# Patient Record
Sex: Female | Born: 1990
Health system: Southern US, Community
[De-identification: ages and names within clinical notes are randomized; demographics above are authoritative.]

## PROBLEM LIST (undated history)

## (undated) ENCOUNTER — Inpatient Hospital Stay (HOSPITAL_COMMUNITY): Payer: Self-pay

## (undated) DIAGNOSIS — I10 Essential (primary) hypertension: Secondary | ICD-10-CM

## (undated) DIAGNOSIS — B009 Herpesviral infection, unspecified: Secondary | ICD-10-CM

## (undated) DIAGNOSIS — J302 Other seasonal allergic rhinitis: Secondary | ICD-10-CM

## (undated) DIAGNOSIS — A599 Trichomoniasis, unspecified: Secondary | ICD-10-CM

## (undated) DIAGNOSIS — O039 Complete or unspecified spontaneous abortion without complication: Secondary | ICD-10-CM

## (undated) DIAGNOSIS — L309 Dermatitis, unspecified: Secondary | ICD-10-CM

## (undated) HISTORY — PX: NO PAST SURGERIES: SHX2092

---
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
## 2012-01-25 ENCOUNTER — Emergency Department (HOSPITAL_BASED_OUTPATIENT_CLINIC_OR_DEPARTMENT_OTHER)
Admission: EM | Admit: 2012-01-25 | Discharge: 2012-01-25 | Disposition: A | Discharge status: Home / Self Care | Payer: Self-pay | Attending: Emergency Medicine | Admitting: Emergency Medicine

## 2012-01-25 ENCOUNTER — Encounter (HOSPITAL_BASED_OUTPATIENT_CLINIC_OR_DEPARTMENT_OTHER): Payer: Self-pay | Admitting: *Deleted

## 2012-01-25 DIAGNOSIS — B86 Scabies: Secondary | ICD-10-CM | POA: Insufficient documentation

## 2012-01-25 MED ORDER — PERMETHRIN 5 % EX CREA: TOPICAL_CREAM | CUTANEOUS | Status: AC

## 2012-01-25 NOTE — ED Notes (Signed)
EDPA Sofia at bedside. 

## 2012-01-25 NOTE — ED Notes (Signed)
Pt reports she has rash to face and right foot x 1 day- reports her brother was treated to scabies 1 month ago

## 2012-01-25 NOTE — ED Notes (Signed)
rx x 1 given- d/c with mother

## 2012-01-26 NOTE — ED Provider Notes (Signed)
History     CSN: [id number]  Arrival date & time 01/25/12  2054   First MD Initiated Contact with Patient 01/25/12 2137      Chief Complaint  Patient presents with  . Rash    (Consider location/radiation/quality/duration/timing/severity/associated sxs/prior treatment) Patient is a 21 y.o. female presenting with rash. The history is provided by the patient. No language interpreter was used.  Rash  This is a new problem. The current episode started more than 1 week ago. The problem has been gradually worsening. Associated with: pt reports sibling recently treated for scabies. The rash is present on the right hand, left hand and face. The pain is moderate. Associated symptoms include itching.    History reviewed. No pertinent past medical history.  History reviewed. No pertinent past surgical history.  No family history on file.  History  Substance Use Topics  . Smoking status: Never Smoker   . Smokeless tobacco: Never Used  . Alcohol Use: No    OB History    Grav Para Term Preterm Abortions TAB SAB Ect Mult Living                  Review of Systems  Skin: Positive for itching and rash.  All other systems reviewed and are negative.    Allergies  Review of patient's allergies indicates no known allergies.  Home Medications   Current Outpatient Rx  Name Route Sig Dispense Refill  . PERMETHRIN 5 % EX CREA  Apply to affected area once 60 g 1    BP 147/85  Pulse 82  Temp 99.4 F (37.4 C) (Oral)  Resp 18  Ht 5\' 9"  (1.753 m)  Wt 230 lb (104.327 kg)  BMI 33.96 kg/m2  SpO2 100%  LMP 01/17/2012  Physical Exam  Nursing note and vitals reviewed. Constitutional: She is oriented to person, place, and time. She appears well-developed and well-nourished.  HENT:  Head: Normocephalic.  Eyes: Pupils are equal, round, and reactive to light.  Cardiovascular: Normal rate.   Pulmonary/Chest: Effort normal.  Musculoskeletal: Normal range of motion.  Neurological: She  is alert and oriented to person, place, and time. She has normal reflexes.  Skin: Rash noted.  Psychiatric: She has a normal mood and affect.    ED Course  Procedures (including critical care time)  Labs Reviewed - No data to display No results found.   1. Scabies       MDM  elemite       Lonia Skinner [place], [place] 01/26/12 1539

## 2012-01-27 NOTE — ED Provider Notes (Signed)
Medical screening examination/treatment/procedure(s) were performed by non-physician practitioner and as supervising physician I was immediately available for consultation/collaboration.   Charles B. Bernette Mayers, MD 01/27/12 [phone]

## 2012-03-08 ENCOUNTER — Encounter (HOSPITAL_BASED_OUTPATIENT_CLINIC_OR_DEPARTMENT_OTHER): Payer: Self-pay | Admitting: *Deleted

## 2012-03-08 ENCOUNTER — Emergency Department (HOSPITAL_BASED_OUTPATIENT_CLINIC_OR_DEPARTMENT_OTHER)
Admission: EM | Admit: 2012-03-08 | Discharge: 2012-03-08 | Disposition: A | Discharge status: Home / Self Care | Payer: Self-pay | Attending: Emergency Medicine | Admitting: Emergency Medicine

## 2012-03-08 DIAGNOSIS — A499 Bacterial infection, unspecified: Secondary | ICD-10-CM | POA: Insufficient documentation

## 2012-03-08 DIAGNOSIS — B3731 Acute candidiasis of vulva and vagina: Secondary | ICD-10-CM | POA: Insufficient documentation

## 2012-03-08 DIAGNOSIS — B373 Candidiasis of vulva and vagina: Secondary | ICD-10-CM

## 2012-03-08 DIAGNOSIS — Z202 Contact with and (suspected) exposure to infections with a predominantly sexual mode of transmission: Secondary | ICD-10-CM | POA: Insufficient documentation

## 2012-03-08 DIAGNOSIS — B9689 Other specified bacterial agents as the cause of diseases classified elsewhere: Secondary | ICD-10-CM | POA: Insufficient documentation

## 2012-03-08 DIAGNOSIS — N760 Acute vaginitis: Secondary | ICD-10-CM | POA: Insufficient documentation

## 2012-03-08 LAB — URINALYSIS, ROUTINE W REFLEX MICROSCOPIC
Glucose, UA: NEGATIVE mg/dL
Ketones, ur: NEGATIVE mg/dL
Nitrite: NEGATIVE
Protein, ur: NEGATIVE mg/dL
pH: 7 (ref 5.0–8.0)

## 2012-03-08 LAB — PREGNANCY, URINE: Preg Test, Ur: NEGATIVE

## 2012-03-08 LAB — URINE MICROSCOPIC-ADD ON

## 2012-03-08 LAB — WET PREP, GENITAL

## 2012-03-08 MED ORDER — AZITHROMYCIN 250 MG PO TABS
1000.0000 mg | ORAL_TABLET | Freq: Once | ORAL | Status: AC
  Administered 2012-03-08: 1000 mg via ORAL
  Filled 2012-03-08: qty 4

## 2012-03-08 MED ORDER — METRONIDAZOLE 500 MG PO TABS: 500.0000 mg | ORAL_TABLET | Freq: Two times a day (BID) | ORAL | Status: DC

## 2012-03-08 MED ORDER — CEFTRIAXONE SODIUM 1 G IJ SOLR
1.0000 g | Freq: Once | INTRAMUSCULAR | Status: AC
  Administered 2012-03-08: 1 g via INTRAMUSCULAR
  Filled 2012-03-08: qty 10

## 2012-03-08 MED ORDER — FLUCONAZOLE 150 MG PO TABS: 150.0000 mg | ORAL_TABLET | Freq: Once | ORAL | Status: DC

## 2012-03-08 NOTE — ED Provider Notes (Signed)
History     CSN: [id number]  Arrival date & time 03/08/12  4098   First MD Initiated Contact with Patient 03/08/12 1953      Chief Complaint  Patient presents with  . Vaginal Discharge    (Consider location/radiation/quality/duration/timing/severity/associated sxs/prior treatment) HPI Comments: 21 y/o female presents to the ED complaining of "creamy" white vaginal discharge for the past 2 days. Admits to having unprotected intercourse with a new partner 1 week ago. Prior to that she had no discharge. She is unsure if he has any STDs, but she is concerned. Admits to associated dysuria, vaginal itching and odor. Denies abdominal or pelvic pain, fever or chills, rashes, vaginal bleeding, hematuria, nausea or vomiting.  Patient is a 21 y.o. female presenting with vaginal discharge. The history is provided by the patient.  Vaginal Discharge Pertinent negatives include no abdominal pain, arthralgias, chest pain, chills, fever, nausea, neck pain, rash or vomiting.    History reviewed. No pertinent past medical history.  History reviewed. No pertinent past surgical history.  No family history on file.  History  Substance Use Topics  . Smoking status: Never Smoker   . Smokeless tobacco: Never Used  . Alcohol Use: No    OB History    Grav Para Term Preterm Abortions TAB SAB Ect Mult Living                  Review of Systems  Constitutional: Negative for fever and chills.  HENT: Negative for neck pain.   Respiratory: Negative for shortness of breath.   Cardiovascular: Negative for chest pain.  Gastrointestinal: Negative for nausea, vomiting and abdominal pain.  Genitourinary: Positive for dysuria and vaginal discharge. Negative for hematuria, vaginal bleeding, genital sores, vaginal pain, pelvic pain and dyspareunia.  Musculoskeletal: Negative for back pain and arthralgias.  Skin: Negative for rash.  Neurological: Negative for dizziness and light-headedness.    Psychiatric/Behavioral: The patient is not nervous/anxious.     Allergies  Review of patient's allergies indicates no known allergies.  Home Medications  No current outpatient prescriptions on file.  BP 143/71  Pulse 90  Temp 98.3 F (36.8 C) (Oral)  Resp 16  SpO2 100%  LMP 02/11/2012  Physical Exam  Constitutional: She is oriented to person, place, and time. She appears well-developed and well-nourished. No distress.  HENT:  Head: Normocephalic and atraumatic.  Mouth/Throat: Oropharynx is clear and moist.  Eyes: Conjunctivae normal are normal.  Neck: Normal range of motion.  Cardiovascular: Normal rate, regular rhythm and normal heart sounds.   Pulmonary/Chest: Effort normal and breath sounds normal.  Abdominal: Soft. Bowel sounds are normal. There is no tenderness.  Genitourinary: Uterus normal. There is no rash or tenderness on the right labia. There is no rash or tenderness on the left labia. Cervix exhibits discharge (thick, white) and friability. Cervix exhibits no motion tenderness. Right adnexum displays no tenderness. Left adnexum displays no tenderness. There is erythema around the vagina. No bleeding around the vagina. Vaginal discharge (thick, white) found.  Musculoskeletal: Normal range of motion.  Neurological: She is alert and oriented to person, place, and time.  Skin: Skin is warm and dry. No rash noted. She is not diaphoretic. No erythema.  Psychiatric: She has a normal mood and affect. Her behavior is normal.    ED Course  Procedures (including critical care time)   Labs Reviewed  WET PREP, GENITAL  GC/CHLAMYDIA PROBE AMP, GENITAL  URINALYSIS, ROUTINE W REFLEX MICROSCOPIC  PREGNANCY, URINE   Results for  orders placed during the hospital encounter of 03/08/12  WET PREP, GENITAL      Component Value Range   Yeast Wet Prep HPF POC FEW (*) NONE SEEN   Trich, Wet Prep NONE SEEN  NONE SEEN   Clue Cells Wet Prep HPF POC MODERATE (*) NONE SEEN   WBC, Wet  Prep HPF POC MANY (*) NONE SEEN  URINALYSIS, ROUTINE W REFLEX MICROSCOPIC      Component Value Range   Color, Urine YELLOW  YELLOW   APPearance CLOUDY (*) CLEAR   Specific Gravity, Urine 1.025  1.005 - 1.030   pH 7.0  5.0 - 8.0   Glucose, UA NEGATIVE  NEGATIVE mg/dL   Hgb urine dipstick NEGATIVE  NEGATIVE   Bilirubin Urine NEGATIVE  NEGATIVE   Ketones, ur NEGATIVE  NEGATIVE mg/dL   Protein, ur NEGATIVE  NEGATIVE mg/dL   Urobilinogen, UA 1.0  0.0 - 1.0 mg/dL   Nitrite NEGATIVE  NEGATIVE   Leukocytes, UA LARGE (*) NEGATIVE  PREGNANCY, URINE      Component Value Range   Preg Test, Ur NEGATIVE  NEGATIVE  URINE MICROSCOPIC-ADD ON      Component Value Range   Squamous Epithelial / LPF FEW (*) RARE   WBC, UA 7-10  <3 WBC/hpf   Bacteria, UA FEW (*) RARE    No results found.   1. BV (bacterial vaginosis)   2. Vaginal yeast infection   3. Potential exposure to STD       MDM  21 y/o female with vaginal discharge and concern for STDs. Wet prep showing both yeast and clue cells. Will treat for both BV and yeast infection. Also will give rocephin and azithromycin to cover for gc/chlamydia due to unprotected intercourse with new partner. No CMT.        Trevor Mace, PA-C 03/08/12 2041

## 2012-03-08 NOTE — ED Notes (Signed)
Pt reports creamy white vaginal discharge since this past Tuesday. Reports having unprotected sex a week ago and found out that partner has an STD. She wants be examined for possible STD.

## 2012-03-09 LAB — GC/CHLAMYDIA PROBE AMP, GENITAL: Chlamydia, DNA Probe: NEGATIVE

## 2012-03-09 NOTE — ED Provider Notes (Signed)
Medical screening examination/treatment/procedure(s) were performed by non-physician practitioner and as supervising physician I was immediately available for consultation/collaboration.  Kadi Hession, MD 03/09/12 1944 

## 2012-07-14 ENCOUNTER — Encounter (HOSPITAL_COMMUNITY): Payer: Self-pay | Admitting: *Deleted

## 2012-07-14 ENCOUNTER — Inpatient Hospital Stay (HOSPITAL_COMMUNITY): Payer: Self-pay

## 2012-07-14 ENCOUNTER — Inpatient Hospital Stay (HOSPITAL_COMMUNITY)
Admission: AD | Admit: 2012-07-14 | Discharge: 2012-07-15 | Disposition: A | Discharge status: Home / Self Care | Payer: Self-pay | Source: Ambulatory Visit | Attending: Obstetrics & Gynecology | Admitting: Obstetrics & Gynecology

## 2012-07-14 DIAGNOSIS — M549 Dorsalgia, unspecified: Secondary | ICD-10-CM | POA: Insufficient documentation

## 2012-07-14 DIAGNOSIS — N949 Unspecified condition associated with female genital organs and menstrual cycle: Secondary | ICD-10-CM | POA: Insufficient documentation

## 2012-07-14 DIAGNOSIS — O2390 Unspecified genitourinary tract infection in pregnancy, unspecified trimester: Secondary | ICD-10-CM | POA: Insufficient documentation

## 2012-07-14 DIAGNOSIS — A499 Bacterial infection, unspecified: Secondary | ICD-10-CM | POA: Insufficient documentation

## 2012-07-14 DIAGNOSIS — N938 Other specified abnormal uterine and vaginal bleeding: Secondary | ICD-10-CM | POA: Insufficient documentation

## 2012-07-14 DIAGNOSIS — N83209 Unspecified ovarian cyst, unspecified side: Secondary | ICD-10-CM | POA: Insufficient documentation

## 2012-07-14 DIAGNOSIS — N760 Acute vaginitis: Secondary | ICD-10-CM | POA: Insufficient documentation

## 2012-07-14 DIAGNOSIS — Z331 Pregnant state, incidental: Secondary | ICD-10-CM

## 2012-07-14 DIAGNOSIS — R109 Unspecified abdominal pain: Secondary | ICD-10-CM | POA: Insufficient documentation

## 2012-07-14 DIAGNOSIS — B9689 Other specified bacterial agents as the cause of diseases classified elsewhere: Secondary | ICD-10-CM | POA: Insufficient documentation

## 2012-07-14 DIAGNOSIS — O34599 Maternal care for other abnormalities of gravid uterus, unspecified trimester: Secondary | ICD-10-CM | POA: Insufficient documentation

## 2012-07-14 LAB — URINE MICROSCOPIC-ADD ON

## 2012-07-14 LAB — WET PREP, GENITAL
Trich, Wet Prep: NONE SEEN
Yeast Wet Prep HPF POC: NONE SEEN

## 2012-07-14 LAB — URINALYSIS, ROUTINE W REFLEX MICROSCOPIC
Bilirubin Urine: NEGATIVE
Ketones, ur: NEGATIVE mg/dL
Nitrite: NEGATIVE
Protein, ur: NEGATIVE mg/dL
Specific Gravity, Urine: 1.015 (ref 1.005–1.030)
Urobilinogen, UA: 2 mg/dL — ABNORMAL HIGH (ref 0.0–1.0)

## 2012-07-14 LAB — CBC
HCT: 25.4 % — ABNORMAL LOW (ref 36.0–46.0)
Hemoglobin: 7.1 g/dL — ABNORMAL LOW (ref 12.0–15.0)
MCHC: 28 g/dL — ABNORMAL LOW (ref 30.0–36.0)
WBC: 9.2 10*3/uL (ref 4.0–10.5)

## 2012-07-14 NOTE — MAU Note (Signed)
For the past couple months I've been having 2 periods a month. Been having lower back and lower stomach pains for 2wks. Feel like i have to throw up but don't. It just sits there.

## 2012-07-14 NOTE — MAU Provider Note (Signed)
History     CSN: [id number]  Arrival date and time: 07/14/12 2230   None     Chief Complaint  Patient presents with  . Back Pain  . Abdominal Pain   HPI  Pt is a G1P0 at unknown gestation here with irregular bleeding.  Unaware of positive pregnancy, thought she was having her period twice a month.  Reports midpelvic pain that started two weeks ago that radiates to lower back.  + nausea, without vomiting that started today.     Past Medical History  Diagnosis Date  . Medical history non-contributory     Past Surgical History  Procedure Laterality Date  . No past surgeries      Family History  Problem Relation Age of Onset  . Hypertension Maternal Grandfather     History  Substance Use Topics  . Smoking status: Never Smoker   . Smokeless tobacco: Never Used  . Alcohol Use: No    Allergies: No Known Allergies  Prescriptions prior to admission  Medication Sig Dispense Refill  . fluconazole (DIFLUCAN) 150 MG tablet Take 1 tablet (150 mg total) by mouth once.  1 tablet  1  . metroNIDAZOLE (FLAGYL) 500 MG tablet Take 1 tablet (500 mg total) by mouth 2 (two) times daily. One po bid x 7 days  14 tablet  0    Review of Systems  Constitutional: Negative.   Gastrointestinal: Positive for nausea and abdominal pain (midpelvic). Negative for vomiting, diarrhea and constipation.  Genitourinary: Positive for hematuria. Negative for dysuria and urgency.  Musculoskeletal: Positive for back pain.  All other systems reviewed and are negative.   Physical Exam   Blood pressure 144/71, pulse 80, temperature 98.1 F (36.7 C), temperature source Oral, resp. rate 20, last menstrual period 07/05/2012, SpO2 100.00%.  Physical Exam  Constitutional: She is oriented to person, place, and time. She appears well-developed and well-nourished. No distress.  HENT:  Head: Normocephalic.  Neck: Normal range of motion. Neck supple.  Cardiovascular: Normal rate, regular rhythm and normal  heart sounds.  Exam reveals no gallop and no friction rub.   No murmur heard. Respiratory: Effort normal and breath sounds normal. No respiratory distress.  GI: Soft. She exhibits no mass. There is no tenderness. There is no rebound, no guarding and no CVA tenderness.  Genitourinary: Cervix exhibits no motion tenderness and no discharge. Vaginal discharge (white, creamy; fishy smell) found.  Musculoskeletal: Normal range of motion.  Neurological: She is alert and oriented to person, place, and time.  Skin: Skin is warm and dry.  Psychiatric: She has a normal mood and affect.    MAU Course  Procedures Results for orders placed during the hospital encounter of 07/14/12 (from the past 24 hour(s))  URINALYSIS, ROUTINE W REFLEX MICROSCOPIC     Status: Abnormal   Collection Time    07/14/12 10:50 PM      Result Value Range   Color, Urine YELLOW  YELLOW   APPearance CLEAR  CLEAR   Specific Gravity, Urine 1.015  1.005 - 1.030   pH 7.0  5.0 - 8.0   Glucose, UA NEGATIVE  NEGATIVE mg/dL   Hgb urine dipstick MODERATE (*) NEGATIVE   Bilirubin Urine NEGATIVE  NEGATIVE   Ketones, ur NEGATIVE  NEGATIVE mg/dL   Protein, ur NEGATIVE  NEGATIVE mg/dL   Urobilinogen, UA 2.0 (*) 0.0 - 1.0 mg/dL   Nitrite NEGATIVE  NEGATIVE   Leukocytes, UA NEGATIVE  NEGATIVE  URINE MICROSCOPIC-ADD ON  Status: Abnormal   Collection Time    07/14/12 10:50 PM      Result Value Range   Squamous Epithelial / LPF FEW (*) RARE   WBC, UA 0-2  <3 WBC/hpf   RBC / HPF 3-6  <3 RBC/hpf   Bacteria, UA RARE  RARE   Urine-Other MUCOUS PRESENT    POCT PREGNANCY, URINE     Status: Abnormal   Collection Time    07/14/12 10:58 PM      Result Value Range   Preg Test, Ur POSITIVE (*) NEGATIVE  WET PREP, GENITAL     Status: Abnormal   Collection Time    07/14/12 11:30 PM      Result Value Range   Yeast Wet Prep HPF POC NONE SEEN  NONE SEEN   Trich, Wet Prep NONE SEEN  NONE SEEN   Clue Cells Wet Prep HPF POC MODERATE (*) NONE  SEEN   WBC, Wet Prep HPF POC FEW (*) NONE SEEN  CBC     Status: Abnormal   Collection Time    07/14/12 11:36 PM      Result Value Range   WBC 9.2  4.0 - 10.5 K/uL   RBC 4.42  3.87 - 5.11 MIL/uL   Hemoglobin 7.1 (*) 12.0 - 15.0 g/dL   HCT 40.9 (*) 81.1 - 91.4 %   MCV 57.5 (*) 78.0 - 100.0 fL   MCH 16.1 (*) 26.0 - 34.0 pg   MCHC 28.0 (*) 30.0 - 36.0 g/dL   RDW 78.2 (*) 95.6 - 21.3 %   Platelets 486 (*) 150 - 400 K/uL  HCG, QUANTITATIVE, PREGNANCY     Status: Abnormal   Collection Time    07/14/12 11:36 PM      Result Value Range   hCG, Beta Chain, Quant, S 65 (*) <5 mIU/mL  ABO/RH     Status: None   Collection Time    07/14/12 11:36 PM      Result Value Range   ABO/RH(D) O POS       Ultrasound: IMPRESSION:  No intrauterine gestational sac is visualized, possibly due to  early stage of pregnancy. Previous spontaneous abortion or occult  ectopic pregnancy are not entirely excluded. Recommend follow-up  with serial quantitative beta HCG levels and / or follow-up  ultrasound in 2-4 weeks for further evaluation. Simple cyst in the  right ovary consistent with functional cyst. Complex cyst in the  left ovary likely representing hemorrhagic cyst or endometrioma.  Follow-up in 6-12 weeks is recommended.  Consulted with Dr. Erin Fulling > reviewed HPI/exam>ensure pt follow-up with BHCG in 48 hours.  Assessment and Plan  Bacterial Vaginosis Ovarian Cyst Incidental Pregnancy  Plan: RX Flagyl Repeat BHCG in 48 hours   [facility] 07/14/2012, 11:21 PM

## 2012-07-15 ENCOUNTER — Other Ambulatory Visit: Payer: Self-pay | Admitting: Family

## 2012-07-15 DIAGNOSIS — A499 Bacterial infection, unspecified: Secondary | ICD-10-CM

## 2012-07-15 DIAGNOSIS — N760 Acute vaginitis: Secondary | ICD-10-CM

## 2012-07-15 DIAGNOSIS — B9689 Other specified bacterial agents as the cause of diseases classified elsewhere: Secondary | ICD-10-CM

## 2012-07-15 DIAGNOSIS — O2390 Unspecified genitourinary tract infection in pregnancy, unspecified trimester: Secondary | ICD-10-CM

## 2012-07-15 DIAGNOSIS — O34599 Maternal care for other abnormalities of gravid uterus, unspecified trimester: Secondary | ICD-10-CM

## 2012-07-15 MED ORDER — METRONIDAZOLE 500 MG PO TABS: 500.0000 mg | ORAL_TABLET | Freq: Two times a day (BID) | ORAL | Status: DC

## 2012-07-15 MED ORDER — INTEGRA F 125-1 MG PO CAPS: 1.0000 | ORAL_CAPSULE | Freq: Every day | ORAL | Status: DC

## 2012-07-15 NOTE — Progress Notes (Signed)
W. Muhammed CNM in earlier to discuss test results and d/c plan. Written and verbal d/c instructions given and understanding voiced. 

## 2012-07-15 NOTE — MAU Provider Note (Signed)
Attestation of Attending Supervision of Advanced Practitioner (CNM/NP): Evaluation and management procedures were performed by the Advanced Practitioner under my supervision and collaboration.  I have reviewed the Advanced Practitioner's note and chart, and I agree with the management and plan.  HARRAWAY-SMITH, Livian Vanderbeck 9:13 AM     

## 2012-07-15 NOTE — Progress Notes (Signed)
RX for Integra called in for anemia.  Pt notified regarding low hemoglobin and RX sent to [facility].  Denies dizziness, SOB, or chest pain.

## 2012-07-17 ENCOUNTER — Encounter (HOSPITAL_COMMUNITY): Payer: Self-pay

## 2012-07-17 ENCOUNTER — Inpatient Hospital Stay (HOSPITAL_COMMUNITY)
Admission: AD | Admit: 2012-07-17 | Discharge: 2012-07-17 | Disposition: A | Discharge status: Home / Self Care | Payer: Self-pay | Source: Ambulatory Visit | Attending: Obstetrics & Gynecology | Admitting: Obstetrics & Gynecology

## 2012-07-17 DIAGNOSIS — N390 Urinary tract infection, site not specified: Secondary | ICD-10-CM

## 2012-07-17 DIAGNOSIS — O0281 Inappropriate change in quantitative human chorionic gonadotropin (hCG) in early pregnancy: Secondary | ICD-10-CM

## 2012-07-17 DIAGNOSIS — R109 Unspecified abdominal pain: Secondary | ICD-10-CM | POA: Insufficient documentation

## 2012-07-17 DIAGNOSIS — O99891 Other specified diseases and conditions complicating pregnancy: Secondary | ICD-10-CM | POA: Insufficient documentation

## 2012-07-17 DIAGNOSIS — J069 Acute upper respiratory infection, unspecified: Secondary | ICD-10-CM | POA: Insufficient documentation

## 2012-07-17 HISTORY — DX: Dermatitis, unspecified: L30.9

## 2012-07-17 HISTORY — DX: Trichomoniasis, unspecified: A59.9

## 2012-07-17 LAB — GC/CHLAMYDIA PROBE AMP
CT Probe RNA: NEGATIVE
GC Probe RNA: NEGATIVE

## 2012-07-17 LAB — HCG, QUANTITATIVE, PREGNANCY: hCG, Beta Chain, Quant, S: 69 m[IU]/mL — ABNORMAL HIGH (ref <5)

## 2012-07-17 NOTE — MAU Provider Note (Signed)
Jennifer Roberson is a 22 y.o. female who presents to MAU for follow up. She was evaluated here 2 days ago and her Bhcg was 65. She was treated for BV. She reports no pain or bleeding. She does have upper respiratory congestion.   Results for orders placed during the hospital encounter of 07/17/12 (from the past 24 hour(s))  HCG, QUANTITATIVE, PREGNANCY     Status: Abnormal   Collection Time    07/17/12  9:32 AM      Result Value Range   hCG, Beta Chain, Quant, S 69 (*) <5 mIU/mL    Assessment: 22 y.o. female with abnormal rise in Bhcg   URI  Plan:  Discussed with Dr. Macon Large   Patient to return in 2 days for follow up Bhcg   Ectopic precautions  Discussed with the patient and all questioned fully answered. She will return if any problems arise.

## 2012-07-17 NOTE — MAU Note (Signed)
Pt notes nasal congestion, denies fever or chills, no vomiting or diarrhea. Feels like sinuses are full

## 2012-07-17 NOTE — MAU Note (Signed)
Pt here for repeat bhcg, states bleeding has stopped, pain is decreased, feels more like a bloating pressure, no pain.

## 2012-07-17 NOTE — MAU Note (Signed)
Here for f/u HCG.  C/o sore throat, runny nose (clear drainage)  Body aches; started yesterday.

## 2012-07-17 NOTE — MAU Provider Note (Signed)
Attestation of Attending Supervision of Advanced Practitioner (PA/CNM/NP): Evaluation and management procedures were performed by the Advanced Practitioner under my supervision and collaboration.  I have reviewed the Advanced Practitioner's note and chart, and I agree with the management and plan.  Tawnee Clegg, MD, FACOG Attending Obstetrician & Gynecologist Faculty Practice, [facility]  

## 2012-07-21 ENCOUNTER — Inpatient Hospital Stay (HOSPITAL_COMMUNITY)
Admission: AD | Admit: 2012-07-21 | Discharge: 2012-07-22 | Disposition: A | Discharge status: Home / Self Care | Payer: Self-pay | Source: Ambulatory Visit | Attending: Obstetrics & Gynecology | Admitting: Obstetrics & Gynecology

## 2012-07-21 ENCOUNTER — Encounter (HOSPITAL_COMMUNITY): Payer: Self-pay | Admitting: *Deleted

## 2012-07-21 DIAGNOSIS — B349 Viral infection, unspecified: Secondary | ICD-10-CM

## 2012-07-21 DIAGNOSIS — O039 Complete or unspecified spontaneous abortion without complication: Secondary | ICD-10-CM | POA: Insufficient documentation

## 2012-07-21 DIAGNOSIS — B9789 Other viral agents as the cause of diseases classified elsewhere: Secondary | ICD-10-CM | POA: Insufficient documentation

## 2012-07-21 DIAGNOSIS — R51 Headache: Secondary | ICD-10-CM | POA: Insufficient documentation

## 2012-07-21 DIAGNOSIS — J029 Acute pharyngitis, unspecified: Secondary | ICD-10-CM | POA: Insufficient documentation

## 2012-07-21 DIAGNOSIS — D649 Anemia, unspecified: Secondary | ICD-10-CM | POA: Insufficient documentation

## 2012-07-21 NOTE — MAU Note (Addendum)
Having real bad pain everywhere. Bad migraines for 4 days. Advil is not helping. I was supposed to come back Thurs to have hormone level checked but did not due to bad weather

## 2012-07-22 ENCOUNTER — Encounter (HOSPITAL_COMMUNITY): Payer: Self-pay | Admitting: Family

## 2012-07-22 DIAGNOSIS — B9789 Other viral agents as the cause of diseases classified elsewhere: Secondary | ICD-10-CM

## 2012-07-22 LAB — CBC
Hemoglobin: 7 g/dL — ABNORMAL LOW (ref 12.0–15.0)
MCHC: 28.7 g/dL — ABNORMAL LOW (ref 30.0–36.0)
RDW: 19.5 % — ABNORMAL HIGH (ref 11.5–15.5)
WBC: 13.8 10*3/uL — ABNORMAL HIGH (ref 4.0–10.5)

## 2012-07-22 LAB — INFLUENZA PANEL BY PCR (TYPE A & B)
H1N1 flu by pcr: NOT DETECTED
Influenza A By PCR: NEGATIVE

## 2012-07-22 LAB — HCG, QUANTITATIVE, PREGNANCY: hCG, Beta Chain, Quant, S: 5 m[IU]/mL — ABNORMAL HIGH (ref <5)

## 2012-07-22 MED ORDER — ACETAMINOPHEN 325 MG PO TABS
650.0000 mg | ORAL_TABLET | ORAL | Status: AC
  Administered 2012-07-22: 650 mg via ORAL
  Filled 2012-07-22: qty 2

## 2012-07-22 NOTE — MAU Provider Note (Signed)
History     CSN: [id number]  Arrival date and time: 07/21/12 2216   First Provider Initiated Contact with Patient 07/22/12 0038      Chief Complaint  Patient presents with  . Headache  . Generalized Body Aches   HPI  Pt is a G1P0 here with report of generalized body aches that started four days ago.  No report of fever.  +sore throat that stopped yesterday.  Recent exposure to ill boyfriend with similar symptoms.  Seen on 07/14/12 for pelvic pain in pregnancy, too early to visualize on ultrasound.  Did not return on 2/10 for repeat BHCG due to the snow.  No report of pelvic pain today.    Past Medical History  Diagnosis Date  . Eczema   . Trichomonas     Past Surgical History  Procedure Laterality Date  . No past surgeries      Family History  Problem Relation Age of Onset  . Hypertension Maternal Grandfather   . Diabetes Maternal Grandfather   . Asthma Father     History  Substance Use Topics  . Smoking status: Never Smoker   . Smokeless tobacco: Never Used  . Alcohol Use: No    Allergies: No Known Allergies  Prescriptions prior to admission  Medication Sig Dispense Refill  . ibuprofen (ADVIL,MOTRIN) 200 MG tablet Take 400 mg by mouth every 6 (six) hours as needed for pain.      . Fe Fum-FePoly-FA-Vit C-Vit B3 (INTEGRA F) 125-1 MG CAPS Take 1 tablet by mouth daily.  30 capsule  1  . metroNIDAZOLE (FLAGYL) 500 MG tablet Take 1 tablet (500 mg total) by mouth 2 (two) times daily.  14 tablet  0    Review of Systems  Constitutional: Positive for chills and malaise/fatigue. Negative for fever.  HENT: Positive for sore throat.   Respiratory: Positive for cough.   Gastrointestinal: Positive for nausea and abdominal pain. Negative for vomiting.  Musculoskeletal: Positive for myalgias.  Neurological: Positive for headaches.   Physical Exam   Blood pressure 140/74, pulse 90, temperature 99 F (37.2 C), temperature source Oral, resp. rate 20, height 5\' 7"  (1.702 m),  weight 98.703 kg (217 lb 9.6 oz), last menstrual period 07/05/2012, SpO2 100.00%.  Physical Exam  Constitutional: She is oriented to person, place, and time. She appears well-developed and well-nourished.  HENT:  Head: Normocephalic.  Mouth/Throat: Mucous membranes are dry. Posterior oropharyngeal erythema present. No oropharyngeal exudate.  Neck: Normal range of motion. Neck supple.  Cardiovascular: Normal rate, regular rhythm and normal heart sounds.   Respiratory: Effort normal and breath sounds normal.  GI: There is no tenderness.  Genitourinary: No bleeding around the vagina. No vaginal discharge found.  Neurological: She is alert and oriented to person, place, and time. She has normal reflexes.  Skin: Skin is warm and dry. She is not diaphoretic.    MAU Course  Procedures  Results for orders placed during the hospital encounter of 07/21/12 (from the past 24 hour(s))  HCG, QUANTITATIVE, PREGNANCY     Status: Abnormal   Collection Time    07/22/12  1:15 AM      Result Value Range   hCG, Beta Chain, Quant, S 5 (*) <5 mIU/mL  CBC     Status: Abnormal   Collection Time    07/22/12  1:15 AM      Result Value Range   WBC 13.8 (*) 4.0 - 10.5 K/uL   RBC 4.29  3.87 - 5.11 MIL/uL  Hemoglobin 7.0 (*) 12.0 - 15.0 g/dL   HCT 16.1 (*) 09.6 - 04.5 %   MCV 56.9 (*) 78.0 - 100.0 fL   MCH 16.3 (*) 26.0 - 34.0 pg   MCHC 28.7 (*) 30.0 - 36.0 g/dL   RDW 40.9 (*) 81.1 - 91.4 %   Platelets 292  150 - 400 K/uL     Assessment and Plan  Anemia Viral Illness Resolving Pregnancy  Plan: Increase fluids Tylenol as needed for pain/fever Repeat BHCG (per pt request) in one week Pick of RX for Integra and begin taking  [facility] 07/22/2012, 12:39 AM

## 2012-07-22 NOTE — Progress Notes (Signed)
Threasa Heads CNM in earlier and discussed lab results and d/c plan. Written and verbal d/c instructions given and understanding voiced

## 2012-07-23 ENCOUNTER — Inpatient Hospital Stay (HOSPITAL_COMMUNITY)
Admission: AD | Admit: 2012-07-23 | Discharge: 2012-07-23 | Disposition: A | Discharge status: Home / Self Care | Payer: Self-pay | Source: Ambulatory Visit | Attending: Obstetrics & Gynecology | Admitting: Obstetrics & Gynecology

## 2012-07-23 DIAGNOSIS — D649 Anemia, unspecified: Secondary | ICD-10-CM

## 2012-07-23 DIAGNOSIS — Z09 Encounter for follow-up examination after completed treatment for conditions other than malignant neoplasm: Secondary | ICD-10-CM | POA: Insufficient documentation

## 2012-07-23 LAB — CBC
MCH: 16 pg — ABNORMAL LOW (ref 26.0–34.0)
MCV: 57.8 fL — ABNORMAL LOW (ref 78.0–100.0)
Platelets: 342 10*3/uL (ref 150–400)
RBC: 4.55 MIL/uL (ref 3.87–5.11)

## 2012-07-23 MED ORDER — ONDANSETRON 8 MG PO TBDP
8.0000 mg | ORAL_TABLET | Freq: Once | ORAL | Status: AC
  Administered 2012-07-23: 8 mg via ORAL
  Filled 2012-07-23: qty 1

## 2012-07-23 NOTE — MAU Note (Signed)
Bleeding like a period, light headed, nausea, stomach pain, onset of vaginal bleeding like a period during the middle of the night.

## 2012-07-23 NOTE — MAU Provider Note (Signed)
  History     CSN: [id number]  Arrival date and time: 07/23/12 1433   First Provider Initiated Contact with Patient 07/23/12 1528      Chief Complaint  Patient presents with  . Vaginal Bleeding   HPI Pt is s/p SAB with HCG <5 yesterday. Pt had Hcg 65 last week.. During the night pt was having increase in cramping and pain and had some increase in bleeding, bleeding in shorts.  Pt took Ibuprofen at 10 am with some relief in pain.  Pt is not cramping now but is dizzy. Pt has known anemia but has not been able to pick up her iron prescription until tomorrow.  Pt denies constipation, diarrhea, UTI symptoms.  Pt is not bleeding much at this time.  Pt is nauseated and feels like she needs to vomit.  Past Medical History  Diagnosis Date  . Eczema   . Trichomonas     Past Surgical History  Procedure Laterality Date  . No past surgeries      Family History  Problem Relation Age of Onset  . Hypertension Maternal Grandfather   . Diabetes Maternal Grandfather   . Asthma Father     History  Substance Use Topics  . Smoking status: Never Smoker   . Smokeless tobacco: Never Used  . Alcohol Use: No    Allergies: No Known Allergies  Prescriptions prior to admission  Medication Sig Dispense Refill  . ibuprofen (ADVIL,MOTRIN) 200 MG tablet Take 400 mg by mouth every 6 (six) hours as needed for pain.        Review of Systems  Gastrointestinal: Positive for nausea and abdominal pain. Negative for vomiting, diarrhea and constipation.  Genitourinary: Negative for dysuria.  Neurological: Positive for dizziness.   Physical Exam   Blood pressure 123/74, pulse 68, temperature 97 F (36.1 C), temperature source Oral, resp. rate 18, last menstrual period 07/05/2012.  Physical Exam  Nursing note and vitals reviewed. Constitutional: She appears well-developed and well-nourished.  HENT:  Head: Normocephalic.  Eyes: Pupils are equal, round, and reactive to light.  Neck: Normal range of  motion. Neck supple.  Cardiovascular: Normal rate.   Respiratory: Effort normal.  GI: Soft. She exhibits no distension. There is no tenderness. There is no rebound and no guarding.  Genitourinary:  sm- mod amount of bright red blood in vault; cervix closed, nontender  Musculoskeletal: Normal range of motion.  Neurological: She is alert.  Skin: Skin is warm and dry.  Psychiatric: She has a normal mood and affect.    MAU Course  Procedures Results for orders placed during the hospital encounter of 07/23/12 (from the past 24 hour(s))  CBC     Status: Abnormal   Collection Time    07/23/12  3:40 PM      Result Value Range   WBC 8.4  4.0 - 10.5 K/uL   RBC 4.55  3.87 - 5.11 MIL/uL   Hemoglobin 7.3 (*) 12.0 - 15.0 g/dL   HCT 40.9 (*) 81.1 - 91.4 %   MCV 57.8 (*) 78.0 - 100.0 fL   MCH 16.0 (*) 26.0 - 34.0 pg   MCHC 27.8 (*) 30.0 - 36.0 g/dL   RDW 78.2 (*) 95.6 - 21.3 %   Platelets 342  150 - 400 K/uL    Assessment and Plan  Anemia Post SAB F/u at [facility] 07/23/2012, 3:28 PM

## 2012-07-23 NOTE — MAU Provider Note (Signed)
Attestation of Attending Supervision of Advanced Practitioner (CNM/NP): Evaluation and management procedures were performed by the Advanced Practitioner under my supervision and collaboration.  I have reviewed the Advanced Practitioner's note and chart, and I agree with the management and plan.  HARRAWAY-SMITH, Janari Gagner 5:29 PM

## 2012-08-04 ENCOUNTER — Emergency Department (HOSPITAL_BASED_OUTPATIENT_CLINIC_OR_DEPARTMENT_OTHER)
Admission: EM | Admit: 2012-08-04 | Discharge: 2012-08-04 | Disposition: A | Discharge status: Home / Self Care | Payer: Self-pay | Attending: Emergency Medicine | Admitting: Emergency Medicine

## 2012-08-04 ENCOUNTER — Encounter (HOSPITAL_BASED_OUTPATIENT_CLINIC_OR_DEPARTMENT_OTHER): Payer: Self-pay | Admitting: *Deleted

## 2012-08-04 DIAGNOSIS — H9209 Otalgia, unspecified ear: Secondary | ICD-10-CM | POA: Insufficient documentation

## 2012-08-04 DIAGNOSIS — R42 Dizziness and giddiness: Secondary | ICD-10-CM | POA: Insufficient documentation

## 2012-08-04 DIAGNOSIS — Z3202 Encounter for pregnancy test, result negative: Secondary | ICD-10-CM | POA: Insufficient documentation

## 2012-08-04 DIAGNOSIS — N898 Other specified noninflammatory disorders of vagina: Secondary | ICD-10-CM | POA: Insufficient documentation

## 2012-08-04 DIAGNOSIS — Z8619 Personal history of other infectious and parasitic diseases: Secondary | ICD-10-CM | POA: Insufficient documentation

## 2012-08-04 DIAGNOSIS — R51 Headache: Secondary | ICD-10-CM | POA: Insufficient documentation

## 2012-08-04 DIAGNOSIS — N390 Urinary tract infection, site not specified: Secondary | ICD-10-CM | POA: Insufficient documentation

## 2012-08-04 DIAGNOSIS — Z872 Personal history of diseases of the skin and subcutaneous tissue: Secondary | ICD-10-CM | POA: Insufficient documentation

## 2012-08-04 DIAGNOSIS — R110 Nausea: Secondary | ICD-10-CM | POA: Insufficient documentation

## 2012-08-04 HISTORY — DX: Complete or unspecified spontaneous abortion without complication: O03.9

## 2012-08-04 LAB — CBC WITH DIFFERENTIAL/PLATELET
Blasts: 0 %
Hemoglobin: 7 g/dL — ABNORMAL LOW (ref 12.0–15.0)
Lymphocytes Relative: 27 % (ref 12–46)
Lymphs Abs: 3.1 10*3/uL (ref 0.7–4.0)
MCHC: 28.5 g/dL — ABNORMAL LOW (ref 30.0–36.0)
Neutro Abs: 7.2 10*3/uL (ref 1.7–7.7)
Neutrophils Relative %: 64 % (ref 43–77)
Platelets: 602 10*3/uL — ABNORMAL HIGH (ref 150–400)
Promyelocytes Absolute: 0 %
RDW: 20.8 % — ABNORMAL HIGH (ref 11.5–15.5)
nRBC: 0 /100 WBC

## 2012-08-04 LAB — WET PREP, GENITAL
Trich, Wet Prep: NONE SEEN
Yeast Wet Prep HPF POC: NONE SEEN

## 2012-08-04 LAB — URINALYSIS, ROUTINE W REFLEX MICROSCOPIC
Bilirubin Urine: NEGATIVE
Glucose, UA: NEGATIVE mg/dL
Hgb urine dipstick: NEGATIVE
Specific Gravity, Urine: 1.026 (ref 1.005–1.030)
Urobilinogen, UA: 0.2 mg/dL (ref 0.0–1.0)

## 2012-08-04 LAB — URINE MICROSCOPIC-ADD ON

## 2012-08-04 MED ORDER — IBUPROFEN 400 MG PO TABS
600.0000 mg | ORAL_TABLET | Freq: Once | ORAL | Status: AC
  Administered 2012-08-04: 600 mg via ORAL
  Filled 2012-08-04: qty 1

## 2012-08-04 MED ORDER — SULFAMETHOXAZOLE-TRIMETHOPRIM 800-160 MG PO TABS: 1.0000 | ORAL_TABLET | Freq: Two times a day (BID) | ORAL | Status: DC

## 2012-08-04 MED ORDER — METRONIDAZOLE 500 MG PO TABS: 500.0000 mg | ORAL_TABLET | Freq: Two times a day (BID) | ORAL | Status: DC

## 2012-08-04 MED ORDER — ONDANSETRON 8 MG PO TBDP: 8.0000 mg | ORAL_TABLET | Freq: Three times a day (TID) | ORAL | Status: DC | PRN

## 2012-08-04 MED ORDER — ONDANSETRON 8 MG PO TBDP
8.0000 mg | ORAL_TABLET | Freq: Once | ORAL | Status: AC
  Administered 2012-08-04: 8 mg via ORAL
  Filled 2012-08-04: qty 1

## 2012-08-04 NOTE — ED Provider Notes (Signed)
History     CSN: [id number]  Arrival date & time 08/04/12  2014   First MD Initiated Contact with Patient 08/04/12 2016      Chief Complaint  Patient presents with  . Abdominal Pain    (Consider location/radiation/quality/duration/timing/severity/associated sxs/prior Treatment)  HPI Jennifer Roberson is a 22 y.o. female who presents to the ED with abdominal pain. The pain started approximately 4 hours ago. The pain is located all over the abdomen. She describes the pain as a tightening like a ball. The pain comes and goes. She rates the pain as 10/10 at its worst.  Associated symptoms include nausea and light-headedness.  LMP 07/02/12. No birth control. Sexually active with one partner x 4 years. Never had a pap smear. Hx of trichomonas and Chlamydia. Hx of pregnancy last month but went to [facility] and told her she had a miscarriage. Last sexual intercourse was today and before that was 2 nights ago. The history was provided by the patient.    Past Medical History  Diagnosis Date  . Eczema   . Trichomonas   . Miscarriage     Past Surgical History  Procedure Laterality Date  . No past surgeries      Family History  Problem Relation Age of Onset  . Hypertension Maternal Grandfather   . Diabetes Maternal Grandfather   . Asthma Father     History  Substance Use Topics  . Smoking status: Never Smoker   . Smokeless tobacco: Never Used  . Alcohol Use: No    OB History   Grav Para Term Preterm Abortions TAB SAB Ect Mult Living   0    0  0         Review of Systems  Constitutional: Negative for fever, chills, diaphoresis and fatigue.  HENT: Positive for ear pain. Negative for congestion, sore throat, facial swelling, neck pain, neck stiffness, dental problem and sinus pressure.   Eyes: Negative for photophobia, pain and discharge.  Respiratory: Negative for cough, chest tightness and wheezing.   Gastrointestinal: Positive for nausea and abdominal pain. Negative  for vomiting, diarrhea, constipation and abdominal distention.  Genitourinary: Positive for vaginal discharge. Negative for dysuria, urgency, frequency, flank pain, vaginal bleeding and difficulty urinating.  Musculoskeletal: Negative for myalgias, back pain and gait problem.  Skin: Negative for color change and rash.  Neurological: Positive for light-headedness and headaches. Negative for dizziness, speech difficulty, weakness and numbness.  Psychiatric/Behavioral: Negative for confusion and agitation. The patient is not nervous/anxious.     Allergies  Review of patient's allergies indicates no known allergies.  Home Medications   Current Outpatient Rx  Name  Route  Sig  Dispense  Refill  . ibuprofen (ADVIL,MOTRIN) 200 MG tablet   Oral   Take 400 mg by mouth every 6 (six) hours as needed for pain.         Marland Kitchen ondansetron (ZOFRAN ODT) 8 MG disintegrating tablet   Oral   Take 1 tablet (8 mg total) by mouth every 8 (eight) hours as needed for nausea.   20 tablet   0   . sulfamethoxazole-trimethoprim (SEPTRA DS) 800-160 MG per tablet   Oral   Take 1 tablet by mouth every 12 (twelve) hours.   10 tablet   0     BP 139/82  Pulse 70  Temp(Src) 97.9 F (36.6 C) (Oral)  Resp 18  Ht 5\' 9"  (1.753 m)  Wt 230 lb (104.327 kg)  BMI 33.95 kg/m2  SpO2 100%  LMP 07/05/2012  Breastfeeding? Unknown  Physical Exam  Nursing note and vitals reviewed. Constitutional: She is oriented to person, place, and time. She appears well-developed and well-nourished.  HENT:  Head: Normocephalic and atraumatic.  Eyes: EOM are normal. Pupils are equal, round, and reactive to light.  Neck: Neck supple.  Cardiovascular: Normal rate.   Pulmonary/Chest: Effort normal. No respiratory distress.  Abdominal: Soft. There is no tenderness. There is no CVA tenderness.  Genitourinary:  External genitalia without lesions. Frothy malodorous discharge vaginal vault. No CMT, no adnexal tenderness, uterus without  palpable enlargement.   Musculoskeletal: Normal range of motion. She exhibits no edema.  Neurological: She is alert and oriented to person, place, and time. No cranial nerve deficit.  Skin: Skin is warm and dry.  Psychiatric: She has a normal mood and affect. Her behavior is normal. Judgment and thought content normal.   Procedures Results for orders placed during the hospital encounter of 08/04/12 (from the past 24 hour(s))  PREGNANCY, URINE     Status: None   Collection Time    08/04/12  8:38 PM      Result Value Range   Preg Test, Ur NEGATIVE  NEGATIVE  URINALYSIS, ROUTINE W REFLEX MICROSCOPIC     Status: Abnormal   Collection Time    08/04/12  8:38 PM      Result Value Range   Color, Urine YELLOW  YELLOW   APPearance CLEAR  CLEAR   Specific Gravity, Urine 1.026  1.005 - 1.030   pH 5.5  5.0 - 8.0   Glucose, UA NEGATIVE  NEGATIVE mg/dL   Hgb urine dipstick NEGATIVE  NEGATIVE   Bilirubin Urine NEGATIVE  NEGATIVE   Ketones, ur NEGATIVE  NEGATIVE mg/dL   Protein, ur NEGATIVE  NEGATIVE mg/dL   Urobilinogen, UA 0.2  0.0 - 1.0 mg/dL   Nitrite NEGATIVE  NEGATIVE   Leukocytes, UA MODERATE (*) NEGATIVE  URINE MICROSCOPIC-ADD ON     Status: Abnormal   Collection Time    08/04/12  8:38 PM      Result Value Range   Squamous Epithelial / LPF RARE  RARE   WBC, UA 7-10  <3 WBC/hpf   RBC / HPF 0-2  <3 RBC/hpf   Bacteria, UA MANY (*) RARE   Urine-Other MUCOUS PRESENT    CBC WITH DIFFERENTIAL     Status: Abnormal   Collection Time    08/04/12  8:53 PM      Result Value Range   WBC 11.3 (*) 4.0 - 10.5 K/uL   RBC 4.38  3.87 - 5.11 MIL/uL   Hemoglobin 7.0 (*) 12.0 - 15.0 g/dL   HCT 16.1 (*) 09.6 - 04.5 %   MCV 56.2 (*) 78.0 - 100.0 fL   MCH 16.0 (*) 26.0 - 34.0 pg   MCHC 28.5 (*) 30.0 - 36.0 g/dL   RDW 40.9 (*) 81.1 - 91.4 %   Platelets 602 (*) 150 - 400 K/uL   Neutrophils Relative 64  43 - 77 %   Lymphocytes Relative 27  12 - 46 %   Monocytes Relative 5  3 - 12 %   Eosinophils  Relative 2  0 - 5 %   Basophils Relative 2 (*) 0 - 1 %   Band Neutrophils 0  0 - 10 %   Metamyelocytes Relative 0     Myelocytes 0     Promyelocytes Absolute 0     Blasts 0     nRBC 0  0 /  100 WBC   Neutro Abs 7.2  1.7 - 7.7 K/uL   Lymphs Abs 3.1  0.7 - 4.0 K/uL   Monocytes Absolute 0.6  0.1 - 1.0 K/uL   Eosinophils Absolute 0.2  0.0 - 0.7 K/uL   Basophils Absolute 0.2 (*) 0.0 - 0.1 K/uL  HCG, QUANTITATIVE, PREGNANCY     Status: None   Collection Time    08/04/12  8:53 PM      Result Value Range   hCG, Beta Chain, Quant, S <1  <5 mIU/mL  WET PREP, GENITAL     Status: Abnormal   Collection Time    08/04/12  9:26 PM      Result Value Range   Yeast Wet Prep HPF POC NONE SEEN  NONE SEEN   Trich, Wet Prep NONE SEEN  NONE SEEN   Clue Cells Wet Prep HPF POC MANY (*) NONE SEEN   WBC, Wet Prep HPF POC MODERATE (*) NONE SEEN    Assessment: 23 y.o. female with abdominal pain   UTI   Bacterial vaginosis   Nausea  Plan:  Zofran   Septra   Flagyl   Follow up with health department   Discussed with the patient and all questioned fully answered.    Medication List    TAKE these medications       metroNIDAZOLE 500 MG tablet  Commonly known as:  FLAGYL  Take 1 tablet (500 mg total) by mouth 2 (two) times daily.     ondansetron 8 MG disintegrating tablet  Commonly known as:  ZOFRAN ODT  Take 1 tablet (8 mg total) by mouth every 8 (eight) hours as needed for nausea.     sulfamethoxazole-trimethoprim 800-160 MG per tablet  Commonly known as:  SEPTRA DS  Take 1 tablet by mouth every 12 (twelve) hours.      ASK your doctor about these medications       ibuprofen 200 MG tablet  Commonly known as:  ADVIL,MOTRIN  Take 400 mg by mouth every 6 (six) hours as needed for pain.         [place], NP 08/04/12 2151  Janne Napoleon, NP 08/04/12 2153

## 2012-08-04 NOTE — ED Notes (Signed)
Pt describes abd pain,nausea and light-headedness x 4 hrs

## 2012-08-05 NOTE — ED Provider Notes (Signed)
Medical screening examination/treatment/procedure(s) were performed by non-physician practitioner and as supervising physician I was immediately available for consultation/collaboration.   Charliegh Vasudevan B. Aalaysia Liggins, MD 08/05/12 1455 

## 2012-08-06 LAB — GC/CHLAMYDIA PROBE AMP: GC Probe RNA: NEGATIVE

## 2012-08-07 LAB — URINE CULTURE

## 2012-08-08 NOTE — ED Notes (Signed)
+   Urine Patient treated with Septra-sensitive to same-chart appended per protocol MD. 

## 2012-10-08 ENCOUNTER — Encounter (HOSPITAL_BASED_OUTPATIENT_CLINIC_OR_DEPARTMENT_OTHER): Payer: Self-pay | Admitting: *Deleted

## 2012-10-08 ENCOUNTER — Emergency Department (HOSPITAL_BASED_OUTPATIENT_CLINIC_OR_DEPARTMENT_OTHER)
Admission: EM | Admit: 2012-10-08 | Discharge: 2012-10-08 | Disposition: A | Discharge status: Home / Self Care | Payer: Self-pay | Attending: Emergency Medicine | Admitting: Emergency Medicine

## 2012-10-08 DIAGNOSIS — Z3202 Encounter for pregnancy test, result negative: Secondary | ICD-10-CM | POA: Insufficient documentation

## 2012-10-08 DIAGNOSIS — B9689 Other specified bacterial agents as the cause of diseases classified elsewhere: Secondary | ICD-10-CM

## 2012-10-08 DIAGNOSIS — Z8619 Personal history of other infectious and parasitic diseases: Secondary | ICD-10-CM | POA: Insufficient documentation

## 2012-10-08 DIAGNOSIS — L293 Anogenital pruritus, unspecified: Secondary | ICD-10-CM | POA: Insufficient documentation

## 2012-10-08 DIAGNOSIS — N760 Acute vaginitis: Secondary | ICD-10-CM | POA: Insufficient documentation

## 2012-10-08 DIAGNOSIS — R300 Dysuria: Secondary | ICD-10-CM | POA: Insufficient documentation

## 2012-10-08 DIAGNOSIS — Z872 Personal history of diseases of the skin and subcutaneous tissue: Secondary | ICD-10-CM | POA: Insufficient documentation

## 2012-10-08 HISTORY — DX: Other seasonal allergic rhinitis: J30.2

## 2012-10-08 LAB — WET PREP, GENITAL
Trich, Wet Prep: NONE SEEN
Yeast Wet Prep HPF POC: NONE SEEN

## 2012-10-08 LAB — URINALYSIS, ROUTINE W REFLEX MICROSCOPIC
Ketones, ur: NEGATIVE mg/dL
Nitrite: NEGATIVE
Specific Gravity, Urine: 1.02 (ref 1.005–1.030)
Urobilinogen, UA: 1 mg/dL (ref 0.0–1.0)
pH: 6.5 (ref 5.0–8.0)

## 2012-10-08 LAB — URINE MICROSCOPIC-ADD ON

## 2012-10-08 MED ORDER — METRONIDAZOLE 500 MG PO TABS: 500.0000 mg | ORAL_TABLET | Freq: Two times a day (BID) | ORAL | Status: DC

## 2012-10-08 NOTE — ED Notes (Signed)
Patient states that she has had a yeast infection for the past week with vaginal discharge.

## 2012-10-08 NOTE — ED Provider Notes (Signed)
History     CSN: [id number]  Arrival date & time 10/08/12  2956   First MD Initiated Contact with Patient 10/08/12 [phone]      Chief Complaint  Patient presents with  . Vaginal Discharge    (Consider location/radiation/quality/duration/timing/severity/associated sxs/prior treatment) HPI 22 year old female G1 P0 A1 complaining today of vaginal discharge and itching with dysuria for several months. She states she was senior on March 1 but did not take her medicines correctly and the symptoms have continued. She feels like she has a used infection. She states it is itching and burning but denies discharge or pain. She states it burns and she urinates also. She has not had nausea, vomiting, or diarrhea. She has had irregular menstrual periods on April 10. She is not using any form of birth control and denies that she is trying to get pregnant. She states she has had an STD in the past. I reviewed her previous medical record from March 1 at that time she was diagnosed with a urinary tract infection and bacterial vaginosis. She was prescribed Bactrim and metronidazole. She states she took these are radically not taking them for 3 days at the time and that her symptoms got better today she took them but not other times and have continued since then. Past Medical History  Diagnosis Date  . Eczema   . Trichomonas   . Miscarriage   . Seasonal allergies     Past Surgical History  Procedure Laterality Date  . No past surgeries      Family History  Problem Relation Age of Onset  . Hypertension Maternal Grandfather   . Diabetes Maternal Grandfather   . Asthma Father     History  Substance Use Topics  . Smoking status: Never Smoker   . Smokeless tobacco: Never Used  . Alcohol Use: No    OB History   Grav Para Term Preterm Abortions TAB SAB Ect Mult Living   0    0  0         Review of Systems  All other systems reviewed and are negative.    Allergies  Review of patient's  allergies indicates no known allergies.  Home Medications   Current Outpatient Rx  Name  Route  Sig  Dispense  Refill  . ibuprofen (ADVIL,MOTRIN) 200 MG tablet   Oral   Take 400 mg by mouth every 6 (six) hours as needed for pain.         . metroNIDAZOLE (FLAGYL) 500 MG tablet   Oral   Take 1 tablet (500 mg total) by mouth 2 (two) times daily.   14 tablet   0   . ondansetron (ZOFRAN ODT) 8 MG disintegrating tablet   Oral   Take 1 tablet (8 mg total) by mouth every 8 (eight) hours as needed for nausea.   20 tablet   0   . sulfamethoxazole-trimethoprim (SEPTRA DS) 800-160 MG per tablet   Oral   Take 1 tablet by mouth every 12 (twelve) hours.   10 tablet   0     BP 137/85  Pulse 73  Temp(Src) 98.1 F (36.7 C) (Oral)  Resp 18  SpO2 100%  LMP 09/10/2012  Physical Exam  Vitals reviewed. Constitutional: She is oriented to person, place, and time. She appears well-developed and well-nourished.  Morbidly obese  HENT:  Head: Normocephalic and atraumatic.  Eyes: Conjunctivae and EOM are normal. Pupils are equal, round, and reactive to light.  Neck: Normal range of  motion. Neck supple.  Cardiovascular: Normal rate, regular rhythm and normal heart sounds.   Pulmonary/Chest: Effort normal and breath sounds normal.  Abdominal: Soft. She exhibits no mass. There is no tenderness. There is no guarding.  Genitourinary:  Patient with some vaginal discharge in vault. She has no cervical motion tenderness or tenderness over uterus or adnexa.  Musculoskeletal: Normal range of motion.  Neurological: She is alert and oriented to person, place, and time.  Skin: Skin is warm and dry.  Psychiatric: She has a normal mood and affect. Her behavior is normal. Judgment and thought content normal.    ED Course  Procedures (including critical care time)  Labs Reviewed - No data to display No results found.   No diagnosis found.    MDM      Results for orders placed during the  hospital encounter of 10/08/12  WET PREP, GENITAL      Result Value Range   Yeast Wet Prep HPF POC NONE SEEN  NONE SEEN   Trich, Wet Prep NONE SEEN  NONE SEEN   Clue Cells Wet Prep HPF POC FEW (*) NONE SEEN   WBC, Wet Prep HPF POC TOO NUMEROUS TO COUNT (*) NONE SEEN  URINALYSIS, ROUTINE W REFLEX MICROSCOPIC      Result Value Range   Color, Urine YELLOW  YELLOW   APPearance CLEAR  CLEAR   Specific Gravity, Urine 1.020  1.005 - 1.030   pH 6.5  5.0 - 8.0   Glucose, UA NEGATIVE  NEGATIVE mg/dL   Hgb urine dipstick NEGATIVE  NEGATIVE   Bilirubin Urine NEGATIVE  NEGATIVE   Ketones, ur NEGATIVE  NEGATIVE mg/dL   Protein, ur NEGATIVE  NEGATIVE mg/dL   Urobilinogen, UA 1.0  0.0 - 1.0 mg/dL   Nitrite NEGATIVE  NEGATIVE   Leukocytes, UA MODERATE (*) NEGATIVE  PREGNANCY, URINE      Result Value Range   Preg Test, Ur NEGATIVE  NEGATIVE  URINE MICROSCOPIC-ADD ON      Result Value Range   Squamous Epithelial / LPF FEW (*) RARE   WBC, UA 3-6  <3 WBC/hpf   RBC / HPF 0-2  <3 RBC/hpf   Bacteria, UA FEW (*) RARE   Labs c.w. bv plan full course metronidazole.   Hilario Quarry, MD 10/08/12 [phone]

## 2012-10-09 LAB — URINE CULTURE

## 2012-10-09 LAB — GC/CHLAMYDIA PROBE AMP
CT Probe RNA: NEGATIVE
GC Probe RNA: NEGATIVE

## 2013-05-25 ENCOUNTER — Emergency Department (HOSPITAL_BASED_OUTPATIENT_CLINIC_OR_DEPARTMENT_OTHER)
Admission: EM | Admit: 2013-05-25 | Discharge: 2013-05-25 | Disposition: A | Discharge status: Home / Self Care | Payer: No Typology Code available for payment source | Attending: Emergency Medicine | Admitting: Emergency Medicine

## 2013-05-25 ENCOUNTER — Emergency Department (HOSPITAL_BASED_OUTPATIENT_CLINIC_OR_DEPARTMENT_OTHER): Payer: No Typology Code available for payment source

## 2013-05-25 ENCOUNTER — Encounter (HOSPITAL_BASED_OUTPATIENT_CLINIC_OR_DEPARTMENT_OTHER): Payer: Self-pay | Admitting: Emergency Medicine

## 2013-05-25 DIAGNOSIS — Z8709 Personal history of other diseases of the respiratory system: Secondary | ICD-10-CM | POA: Insufficient documentation

## 2013-05-25 DIAGNOSIS — S46909A Unspecified injury of unspecified muscle, fascia and tendon at shoulder and upper arm level, unspecified arm, initial encounter: Secondary | ICD-10-CM | POA: Insufficient documentation

## 2013-05-25 DIAGNOSIS — Y9389 Activity, other specified: Secondary | ICD-10-CM | POA: Insufficient documentation

## 2013-05-25 DIAGNOSIS — S161XXA Strain of muscle, fascia and tendon at neck level, initial encounter: Secondary | ICD-10-CM

## 2013-05-25 DIAGNOSIS — S139XXA Sprain of joints and ligaments of unspecified parts of neck, initial encounter: Secondary | ICD-10-CM | POA: Insufficient documentation

## 2013-05-25 DIAGNOSIS — Z872 Personal history of diseases of the skin and subcutaneous tissue: Secondary | ICD-10-CM | POA: Insufficient documentation

## 2013-05-25 DIAGNOSIS — Y92410 Unspecified street and highway as the place of occurrence of the external cause: Secondary | ICD-10-CM | POA: Insufficient documentation

## 2013-05-25 DIAGNOSIS — Z8619 Personal history of other infectious and parasitic diseases: Secondary | ICD-10-CM | POA: Insufficient documentation

## 2013-05-25 DIAGNOSIS — S4980XA Other specified injuries of shoulder and upper arm, unspecified arm, initial encounter: Secondary | ICD-10-CM | POA: Insufficient documentation

## 2013-05-25 MED ORDER — DIAZEPAM 5 MG PO TABS: 5.0000 mg | ORAL_TABLET | Freq: Four times a day (QID) | ORAL | Status: DC | PRN

## 2013-05-25 MED ORDER — HYDROCODONE-ACETAMINOPHEN 5-325 MG PO TABS: 1.0000 | ORAL_TABLET | ORAL | Status: DC | PRN

## 2013-05-25 MED ORDER — IBUPROFEN 800 MG PO TABS
800.0000 mg | ORAL_TABLET | Freq: Once | ORAL | Status: AC
  Administered 2013-05-25: 800 mg via ORAL
  Filled 2013-05-25: qty 1

## 2013-05-25 MED ORDER — IBUPROFEN 800 MG PO TABS: 800.0000 mg | ORAL_TABLET | Freq: Three times a day (TID) | ORAL | Status: DC | PRN

## 2013-05-25 MED ORDER — DIAZEPAM 5 MG PO TABS
5.0000 mg | ORAL_TABLET | Freq: Once | ORAL | Status: AC
  Administered 2013-05-25: 5 mg via ORAL
  Filled 2013-05-25: qty 1

## 2013-05-25 NOTE — ED Notes (Signed)
Pt in radiology.  Will medicate on her return.
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
## 2013-11-27 ENCOUNTER — Encounter (HOSPITAL_BASED_OUTPATIENT_CLINIC_OR_DEPARTMENT_OTHER): Payer: Self-pay | Admitting: Emergency Medicine

## 2013-11-27 ENCOUNTER — Emergency Department (HOSPITAL_BASED_OUTPATIENT_CLINIC_OR_DEPARTMENT_OTHER)
Admission: EM | Admit: 2013-11-27 | Discharge: 2013-11-27 | Disposition: A | Discharge status: Home / Self Care | Payer: No Typology Code available for payment source | Attending: Emergency Medicine | Admitting: Emergency Medicine

## 2013-11-27 DIAGNOSIS — Z8709 Personal history of other diseases of the respiratory system: Secondary | ICD-10-CM | POA: Insufficient documentation

## 2013-11-27 DIAGNOSIS — F17200 Nicotine dependence, unspecified, uncomplicated: Secondary | ICD-10-CM | POA: Insufficient documentation

## 2013-11-27 DIAGNOSIS — Z791 Long term (current) use of non-steroidal anti-inflammatories (NSAID): Secondary | ICD-10-CM | POA: Insufficient documentation

## 2013-11-27 DIAGNOSIS — Z872 Personal history of diseases of the skin and subcutaneous tissue: Secondary | ICD-10-CM | POA: Insufficient documentation

## 2013-11-27 DIAGNOSIS — N760 Acute vaginitis: Secondary | ICD-10-CM | POA: Insufficient documentation

## 2013-11-27 DIAGNOSIS — B9689 Other specified bacterial agents as the cause of diseases classified elsewhere: Secondary | ICD-10-CM | POA: Insufficient documentation

## 2013-11-27 DIAGNOSIS — A499 Bacterial infection, unspecified: Secondary | ICD-10-CM | POA: Insufficient documentation

## 2013-11-27 DIAGNOSIS — Z3202 Encounter for pregnancy test, result negative: Secondary | ICD-10-CM | POA: Insufficient documentation

## 2013-11-27 DIAGNOSIS — N3000 Acute cystitis without hematuria: Secondary | ICD-10-CM | POA: Insufficient documentation

## 2013-11-27 LAB — URINALYSIS, ROUTINE W REFLEX MICROSCOPIC
Bilirubin Urine: NEGATIVE
Glucose, UA: NEGATIVE mg/dL
HGB URINE DIPSTICK: NEGATIVE
Ketones, ur: NEGATIVE mg/dL
Nitrite: NEGATIVE
Protein, ur: NEGATIVE mg/dL
SPECIFIC GRAVITY, URINE: 1.026 (ref 1.005–1.030)
UROBILINOGEN UA: 1 mg/dL (ref 0.0–1.0)
pH: 6.5 (ref 5.0–8.0)

## 2013-11-27 LAB — PREGNANCY, URINE: PREG TEST UR: NEGATIVE

## 2013-11-27 LAB — URINE MICROSCOPIC-ADD ON

## 2013-11-27 LAB — WET PREP, GENITAL
TRICH WET PREP: NONE SEEN
Yeast Wet Prep HPF POC: NONE SEEN

## 2013-11-27 MED ORDER — METRONIDAZOLE 500 MG PO TABS: 500.0000 mg | ORAL_TABLET | Freq: Two times a day (BID) | ORAL | Status: DC

## 2013-11-27 MED ORDER — METRONIDAZOLE 500 MG PO TABS
500.0000 mg | ORAL_TABLET | Freq: Once | ORAL | Status: AC
  Administered 2013-11-27: 500 mg via ORAL
  Filled 2013-11-27: qty 1

## 2013-11-27 MED ORDER — NITROFURANTOIN MONOHYD MACRO 100 MG PO CAPS
100.0000 mg | ORAL_CAPSULE | Freq: Once | ORAL | Status: AC
  Administered 2013-11-27: 100 mg via ORAL
  Filled 2013-11-27: qty 1

## 2013-11-27 MED ORDER — NITROFURANTOIN MONOHYD MACRO 100 MG PO CAPS: 100.0000 mg | ORAL_CAPSULE | Freq: Two times a day (BID) | ORAL | Status: DC

## 2013-11-27 NOTE — ED Notes (Signed)
Pelvic cart to bedside 

## 2013-11-27 NOTE — ED Notes (Signed)
Pt c/o dysuria, vaginal discharge and vaginal irritation x 3 days. Pt denies fever, vomiting. Pt sts she could be pregnant and has had unprotected sex.

## 2013-11-27 NOTE — ED Provider Notes (Signed)
CSN: [id number]     Arrival date & time 11/27/13  1042 History   First MD Initiated Contact with Patient 11/27/13 1107     Chief Complaint  Patient presents with  . Dysuria      HPI  She complains of vaginal pain. She's had some vaginal discharge her last few days. However, it is clear he denies blood, denies purulent discharge. She has urinary frequency and states she's only going "a few drops". Has unprotected intercourse with a single partner. History of STDs, not with this partner. No lobar redness or swelling or itching. No blisters.  Past Medical History  Diagnosis Date  . Eczema   . Trichomonas   . Miscarriage   . Seasonal allergies    Past Surgical History  Procedure Laterality Date  . No past surgeries     Family History  Problem Relation Age of Onset  . Hypertension Maternal Grandfather   . Diabetes Maternal Grandfather   . Asthma Father    History  Substance Use Topics  . Smoking status: Current Every Day Smoker  . Smokeless tobacco: Never Used  . Alcohol Use: Yes   OB History   Grav Para Term Preterm Abortions TAB SAB Ect Mult Living   0    0  0        Review of Systems  Constitutional: Negative for fever, chills, diaphoresis, appetite change and fatigue.  HENT: Negative for mouth sores, sore throat and trouble swallowing.   Eyes: Negative for visual disturbance.  Respiratory: Negative for cough, chest tightness, shortness of breath and wheezing.   Cardiovascular: Negative for chest pain.  Gastrointestinal: Negative for nausea, vomiting, abdominal pain, diarrhea and abdominal distention.  Endocrine: Negative for polydipsia, polyphagia and polyuria.  Genitourinary: Positive for urgency, frequency, decreased urine volume, vaginal discharge, vaginal pain and dyspareunia. Negative for dysuria, hematuria, vaginal bleeding and pelvic pain.  Musculoskeletal: Negative for gait problem.  Skin: Negative for color change, pallor and rash.  Neurological: Negative  for dizziness, syncope, light-headedness and headaches.  Hematological: Does not bruise/bleed easily.  Psychiatric/Behavioral: Negative for behavioral problems and confusion.      Allergies  Review of patient's allergies indicates no known allergies.  Home Medications   Prior to Admission medications   Medication Sig Start Date End Date Taking? Authorizing Provider  diazepam (VALIUM) 5 MG tablet Take 1 tablet (5 mg total) by mouth every 6 (six) hours as needed for anxiety. 05/25/13   Dagmar HaitWilliam Blair Walden, MD  HYDROcodone-acetaminophen (NORCO/VICODIN) 5-325 MG per tablet Take 1 tablet by mouth every 4 (four) hours as needed for moderate pain. 05/25/13   Dagmar HaitWilliam Blair Walden, MD  ibuprofen (ADVIL,MOTRIN) 800 MG tablet Take 1 tablet (800 mg total) by mouth every 8 (eight) hours as needed. 05/25/13   Dagmar HaitWilliam Blair Walden, MD  metroNIDAZOLE (FLAGYL) 500 MG tablet Take 1 tablet (500 mg total) by mouth 2 (two) times daily. 11/27/13   Rolland PorterMark Yisell Sprunger, MD  nitrofurantoin, macrocrystal-monohydrate, (MACROBID) 100 MG capsule Take 1 capsule (100 mg total) by mouth 2 (two) times daily. 11/27/13   Rolland PorterMark Antoria Lanza, MD   LMP 11/06/2013 Physical Exam  Constitutional: She is oriented to person, place, and time. She appears well-developed and well-nourished. No distress.  HENT:  Head: Normocephalic.  Eyes: Conjunctivae are normal. Pupils are equal, round, and reactive to light. No scleral icterus.  Neck: Normal range of motion. Neck supple. No thyromegaly present.  Cardiovascular: Normal rate and regular rhythm.  Exam reveals no gallop and no friction  rub.   No murmur heard. Pulmonary/Chest: Effort normal and breath sounds normal. No respiratory distress. She has no wheezes. She has no rales.  Abdominal: Soft. Bowel sounds are normal. She exhibits no distension. There is no tenderness. There is no rebound.    Genitourinary:       Musculoskeletal: Normal range of motion.  Neurological: She is alert and  oriented to person, place, and time.  Skin: Skin is warm and dry. No rash noted.  Psychiatric: She has a normal mood and affect. Her behavior is normal.    ED Course  Procedures (including critical care time) Labs Review Labs Reviewed  WET PREP, GENITAL - Abnormal; Notable for the following:    Clue Cells Wet Prep HPF POC FEW (*)    WBC, Wet Prep HPF POC MANY (*)    All other components within normal limits  URINALYSIS, ROUTINE W REFLEX MICROSCOPIC - Abnormal; Notable for the following:    Leukocytes, UA MODERATE (*)    All other components within normal limits  GC/CHLAMYDIA PROBE AMP  PREGNANCY, URINE  URINE MICROSCOPIC-ADD ON    Imaging Review No results found.   EKG Interpretation None      MDM   Final diagnoses:  Acute cystitis without hematuria  Bacterial vaginosis    Patient has bacteria and white blood cells in her urine. His clue cells on her swab. GC chlamydia pending. Discussion symptoms consistent with cystitis. Will treat with Flagyl for vaginosis, Macrobid for UTI. We will contact her if GC Chlamydia positive. No plan empiric treatment for STIs at this time.    Rolland PorterMark Aneliz Carbary, MD 11/27/13 1252

## 2013-11-27 NOTE — Discharge Instructions (Signed)
Bacterial Vaginosis °Bacterial vaginosis is a vaginal infection that occurs when the normal balance of bacteria in the vagina is disrupted. It results from an overgrowth of certain bacteria. This is the most common vaginal infection in women of childbearing age. Treatment is important to prevent complications, especially in pregnant women, as it can cause a premature delivery. °CAUSES  °Bacterial vaginosis is caused by an increase in harmful bacteria that are normally present in smaller amounts in the vagina. Several different kinds of bacteria can cause bacterial vaginosis. However, the reason that the condition develops is not fully understood. °RISK FACTORS °Certain activities or behaviors can put you at an increased risk of developing bacterial vaginosis, including: °· Having a new sex partner or multiple sex partners. °· Douching. °· Using an intrauterine device (IUD) for contraception. °Women do not get bacterial vaginosis from toilet seats, bedding, swimming pools, or contact with objects around them. °SIGNS AND SYMPTOMS  °Some women with bacterial vaginosis have no signs or symptoms. Common symptoms include: °· Grey vaginal discharge. °· A fishlike odor with discharge, especially after sexual intercourse. °· Itching or burning of the vagina and vulva. °· Burning or pain with urination. °DIAGNOSIS  °Your health care provider will take a medical history and examine the vagina for signs of bacterial vaginosis. A sample of vaginal fluid may be taken. Your health care provider will look at this sample under a microscope to check for bacteria and abnormal cells. A vaginal pH test may also be done.  °TREATMENT  °Bacterial vaginosis may be treated with antibiotic medicines. These may be given in the form of a pill or a vaginal cream. A second round of antibiotics may be prescribed if the condition comes back after treatment.  °HOME CARE INSTRUCTIONS  °· Only take over-the-counter or prescription medicines as  directed by your health care provider. °· If antibiotic medicine was prescribed, take it as directed. Make sure you finish it even if you start to feel better. °· Do not have sex until treatment is completed. °· Tell all sexual partners that you have a vaginal infection. They should see their health care provider and be treated if they have problems, such as a mild rash or itching. °· Practice safe sex by using condoms and only having one sex partner. °SEEK MEDICAL CARE IF:  °· Your symptoms are not improving after 3 days of treatment. °· You have increased discharge or pain. °· You have a fever. °MAKE SURE YOU:  °· Understand these instructions. °· Will watch your condition. °· Will get help right away if you are not doing well or get worse. °FOR MORE INFORMATION  °Centers for Disease Control and Prevention, Division of STD Prevention: www.cdc.gov/std °American Sexual Health Association (ASHA): www.ashastd.org  °Document Released: 05/23/2005 Document Revised: 03/13/2013 Document Reviewed: 01/02/2013 °ExitCare® Patient Information ©2015 [employer]. This information is not intended to replace advice given to you by your health care provider. Make sure you discuss any questions you have with your health care provider. ° °Urinary Tract Infection °A urinary tract infection (UTI) can occur any place along the urinary tract. The tract includes the kidneys, ureters, bladder, and urethra. A type of germ called bacteria often causes a UTI. UTIs are often helped with antibiotic medicine.  °HOME CARE  °· If given, take antibiotics as told by your doctor. Finish them even if you start to feel better. °· Drink enough fluids to keep your pee (urine) clear or pale yellow. °· Avoid tea, drinks with caffeine,   and bubbly (carbonated) drinks. °· Pee often. Avoid holding your pee in for a long time. °· Pee before and after having sex (intercourse). °· Wipe from front to back after you poop (bowel movement) if you are a woman. Use  each tissue only once. °GET HELP RIGHT AWAY IF:  °· You have back pain. °· You have lower belly (abdominal) pain. °· You have chills. °· You feel sick to your stomach (nauseous). °· You throw up (vomit). °· Your burning or discomfort with peeing does not go away. °· You have a fever. °· Your symptoms are not better in 3 days. °MAKE SURE YOU:  °· Understand these instructions. °· Will watch your condition. °· Will get help right away if you are not doing well or get worse. °Document Released: 11/09/2007 Document Revised: 02/15/2012 Document Reviewed: 12/22/2011 °ExitCare® Patient Information ©2015 [employer]. This information is not intended to replace advice given to you by your health care provider. Make sure you discuss any questions you have with your health care provider. ° °

## 2013-11-28 LAB — GC/CHLAMYDIA PROBE AMP
CT Probe RNA: NEGATIVE
GC Probe RNA: NEGATIVE

## 2013-11-29 ENCOUNTER — Emergency Department (HOSPITAL_BASED_OUTPATIENT_CLINIC_OR_DEPARTMENT_OTHER)
Admission: EM | Admit: 2013-11-29 | Discharge: 2013-11-29 | Disposition: A | Discharge status: Home / Self Care | Payer: No Typology Code available for payment source | Attending: Emergency Medicine | Admitting: Emergency Medicine

## 2013-11-29 ENCOUNTER — Encounter (HOSPITAL_BASED_OUTPATIENT_CLINIC_OR_DEPARTMENT_OTHER): Payer: Self-pay | Admitting: Emergency Medicine

## 2013-11-29 DIAGNOSIS — Z872 Personal history of diseases of the skin and subcutaneous tissue: Secondary | ICD-10-CM | POA: Insufficient documentation

## 2013-11-29 DIAGNOSIS — Z792 Long term (current) use of antibiotics: Secondary | ICD-10-CM | POA: Insufficient documentation

## 2013-11-29 DIAGNOSIS — Z79899 Other long term (current) drug therapy: Secondary | ICD-10-CM | POA: Insufficient documentation

## 2013-11-29 DIAGNOSIS — A6000 Herpesviral infection of urogenital system, unspecified: Secondary | ICD-10-CM | POA: Insufficient documentation

## 2013-11-29 DIAGNOSIS — F17200 Nicotine dependence, unspecified, uncomplicated: Secondary | ICD-10-CM | POA: Insufficient documentation

## 2013-11-29 MED ORDER — HYDROCODONE-ACETAMINOPHEN 5-325 MG PO TABS: 2.0000 | ORAL_TABLET | ORAL | Status: DC | PRN

## 2013-11-29 MED ORDER — VALACYCLOVIR HCL 1 G PO TABS: 1000.0000 mg | ORAL_TABLET | Freq: Two times a day (BID) | ORAL | Status: AC

## 2013-11-29 NOTE — ED Notes (Signed)
Pt has blisters to vaginal and anal area x 2 days.  No vaginal discharge.  No known fever.

## 2013-11-29 NOTE — Discharge Instructions (Signed)
Genital Herpes °Genital herpes is a sexually transmitted disease. This means that it is a disease passed by having sex with an infected person. There is no cure for genital herpes. The time between attacks can be months to years. The virus may live in a person but produce no problems (symptoms). This infection can be passed to a baby as it travels down the birth canal (vagina). In a newborn, this can cause central nervous system damage, eye damage, or even death. The virus that causes genital herpes is usually HSV-2 virus. The virus that causes oral herpes is usually HSV-1. The diagnosis (learning what is wrong) is made through culture results. °SYMPTOMS  °Usually symptoms of pain and itching begin a few days to a week after contact. It first appears as small blisters that progress to small painful ulcers which then scab over and heal after several days. It affects the outer genitalia, birth canal, cervix, penis, anal area, buttocks, and thighs. °HOME CARE INSTRUCTIONS  °· Keep ulcerated areas dry and clean. °· Take medications as directed. Antiviral medications can speed up healing. They will not prevent recurrences or cure this infection. These medications can also be taken for suppression if there are frequent recurrences. °· While the infection is active, it is contagious. Avoid all sexual contact during active infections. °· Condoms may help prevent spread of the herpes virus. °· Practice safe sex. °· Wash your hands thoroughly after touching the genital area. °· Avoid touching your eyes after touching your genital area. °· Inform your caregiver if you have had genital herpes and become pregnant. It is your responsibility to insure a safe outcome for your baby in this pregnancy. °· Only take over-the-counter or prescription medicines for pain, discomfort, or fever as directed by your caregiver. °SEEK MEDICAL CARE IF:  °· You have a recurrence of this infection. °· You do not respond to medications and are not  improving. °· You have new sources of pain or discharge which have changed from the original infection. °· You have an oral temperature above 102° F (38.9° C). °· You develop abdominal pain. °· You develop eye pain or signs of eye infection. °Document Released: 05/20/2000 Document Revised: 08/15/2011 Document Reviewed: 06/10/2009 °ExitCare® Patient Information ©2015 [employer]. This information is not intended to replace advice given to you by your health care provider. Make sure you discuss any questions you have with your health care provider. ° °

## 2013-11-29 NOTE — ED Provider Notes (Signed)
CSN: [id number]     Arrival date & time 11/29/13  1048 History   First MD Initiated Contact with Patient 11/29/13 1053     Chief Complaint  Patient presents with  . Blister     (Consider location/radiation/quality/duration/timing/severity/associated sxs/prior Treatment) HPI Comments: Patient presents to the ER for evaluation of blisters that have developed around her vagina and anus. The patient reports that she first noticed them yesterday. She had been seen 2 days ago for burning with urination, but the lesions were not present at that time. The patient was treated for UTI.   Past Medical History  Diagnosis Date  . Eczema   . Trichomonas   . Miscarriage   . Seasonal allergies    Past Surgical History  Procedure Laterality Date  . No past surgeries     Family History  Problem Relation Age of Onset  . Hypertension Maternal Grandfather   . Diabetes Maternal Grandfather   . Asthma Father    History  Substance Use Topics  . Smoking status: Current Every Day Smoker  . Smokeless tobacco: Never Used  . Alcohol Use: Yes   OB History   Grav Para Term Preterm Abortions TAB SAB Ect Mult Living   0    0  0        Review of Systems  Genitourinary: Positive for dysuria and genital sores.  All other systems reviewed and are negative.     Allergies  Review of patient's allergies indicates no known allergies.  Home Medications   Prior to Admission medications   Medication Sig Start Date End Date Taking? Authorizing Corbin Falck  metroNIDAZOLE (FLAGYL) 500 MG tablet Take 1 tablet (500 mg total) by mouth 2 (two) times daily. 11/27/13   Rolland PorterMark James, MD  nitrofurantoin, macrocrystal-monohydrate, (MACROBID) 100 MG capsule Take 1 capsule (100 mg total) by mouth 2 (two) times daily. 11/27/13   Rolland PorterMark James, MD   BP 157/88  Pulse 86  Temp(Src) 98.3 F (36.8 C) (Oral)  Resp 16  SpO2 100%  LMP 11/13/2013 Physical Exam  Constitutional: She is oriented to person, place, and time. She  appears well-developed and well-nourished. No distress.  HENT:  Head: Normocephalic and atraumatic.  Right Ear: Hearing normal.  Left Ear: Hearing normal.  Nose: Nose normal.  Mouth/Throat: Oropharynx is clear and moist and mucous membranes are normal.  Eyes: Conjunctivae and EOM are normal. Pupils are equal, round, and reactive to light.  Neck: Normal range of motion. Neck supple.  Cardiovascular: Regular rhythm, S1 normal and S2 normal.  Exam reveals no gallop and no friction rub.   No murmur heard. Pulmonary/Chest: Effort normal and breath sounds normal. No respiratory distress. She exhibits no tenderness.  Abdominal: Soft. Normal appearance and bowel sounds are normal. There is no hepatosplenomegaly. There is no tenderness. There is no rebound, no guarding, no tenderness at McBurney's point and negative Murphy's sign. No hernia.  Genitourinary:  Speculum exam deferred because of pain.  Patient has multiple ulcerated lesions on the labia and perianal area  Musculoskeletal: Normal range of motion.  Neurological: She is alert and oriented to person, place, and time. She has normal strength. No cranial nerve deficit or sensory deficit. Coordination normal. GCS eye subscore is 4. GCS verbal subscore is 5. GCS motor subscore is 6.  Skin: Skin is warm, dry and intact. No rash noted. No cyanosis.  Psychiatric: She has a normal mood and affect. Her speech is normal and behavior is normal. Thought content normal.  ED Course  Procedures (including critical care time) Labs Review Labs Reviewed - No data to display  Imaging Review No results found.   EKG Interpretation None      MDM   Final diagnoses:  None   genital herpes   External genital lesions consistent with herpes. Patient will be initiated on treatment.    Gilda Creasehristopher J. Pollina, MD 11/29/13 1124

## 2014-08-07 IMAGING — US US OB COMP LESS 14 WK
1 series · 13 of 28 positions shown · non-contrast
Comparison: CT abdomen and pelvis 05/03/2010

CLINICAL DATA: Pelvic pain. Spotting.  Estimated gestational age by
LMP is 1 week 3 days.  Quantitative beta HCG is 65.

OBSTETRIC <14 WK US AND TRANSVAGINAL OB US
TECHNIQUE: Both transabdominal and transvaginal ultrasound
examinations were performed for complete evaluation of the
gestation as well as the maternal uterus, adnexal regions, and
pelvic cul-de-sac.  Transvaginal technique was performed to assess
early pregnancy.

[Series 1: us ob comp less 14 wks · 13 of 42 slices shown]
[im 2/42]
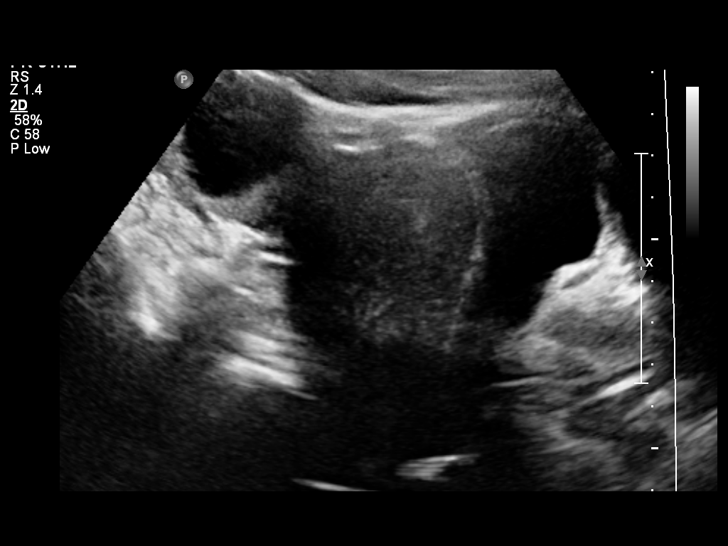
[im 5/42]
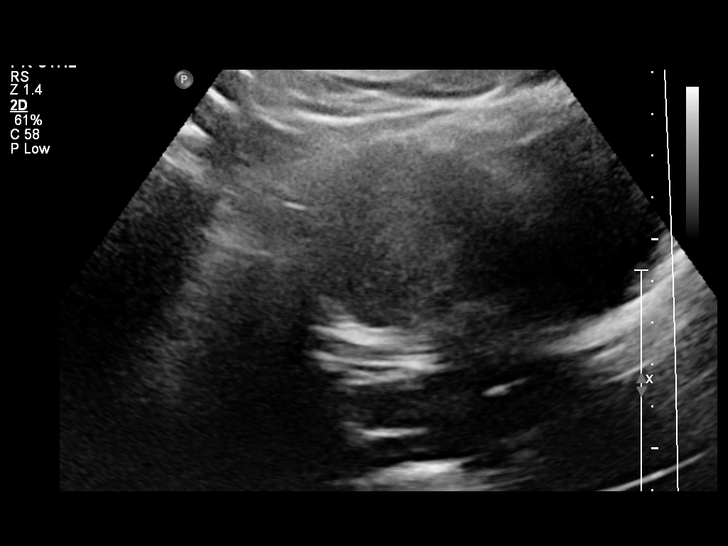
[im 8/42]
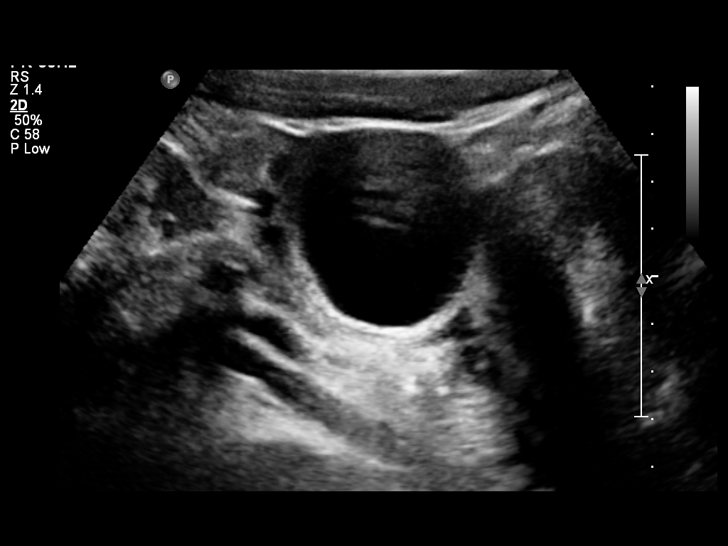
[im 11/42]
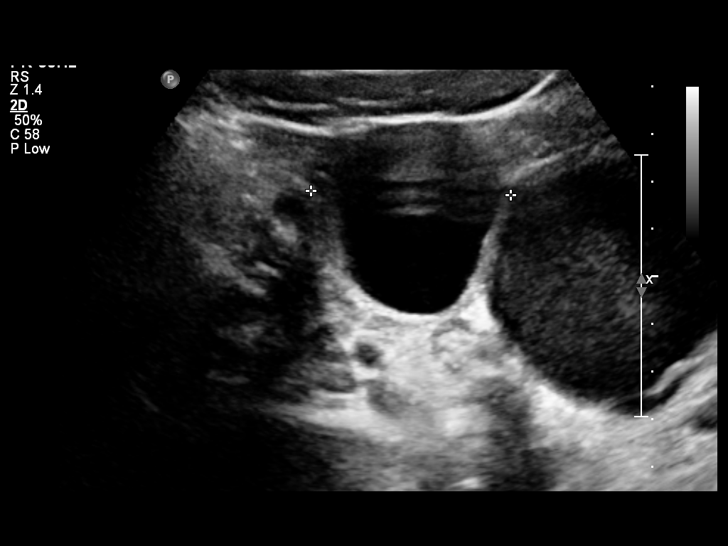
[im 14/42]
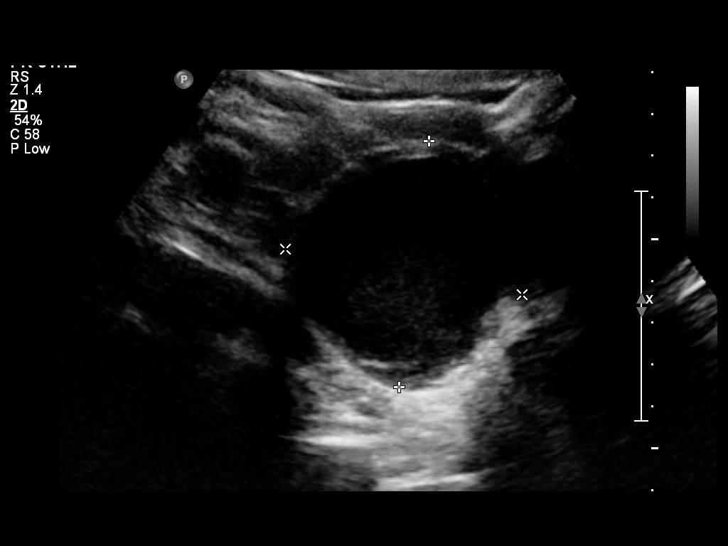
[im 17/42]
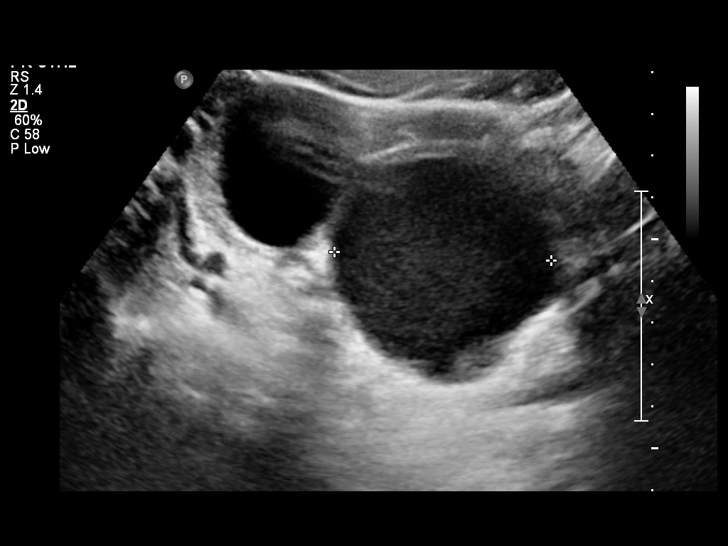
[im 22/42]
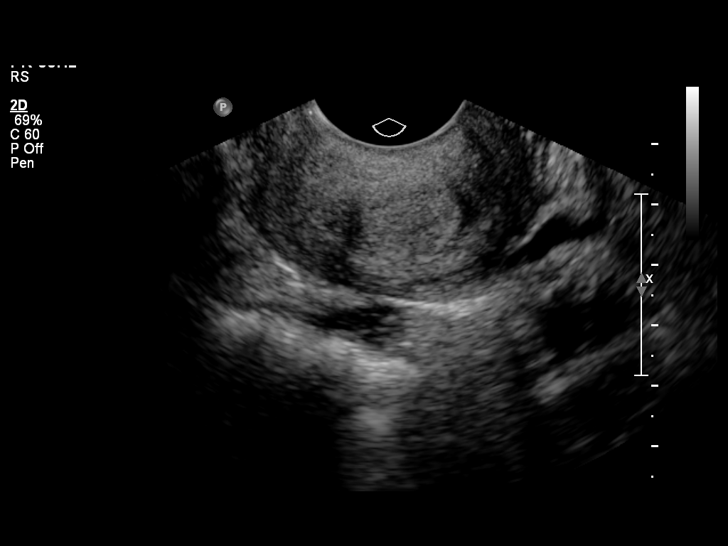
[im 25/42]
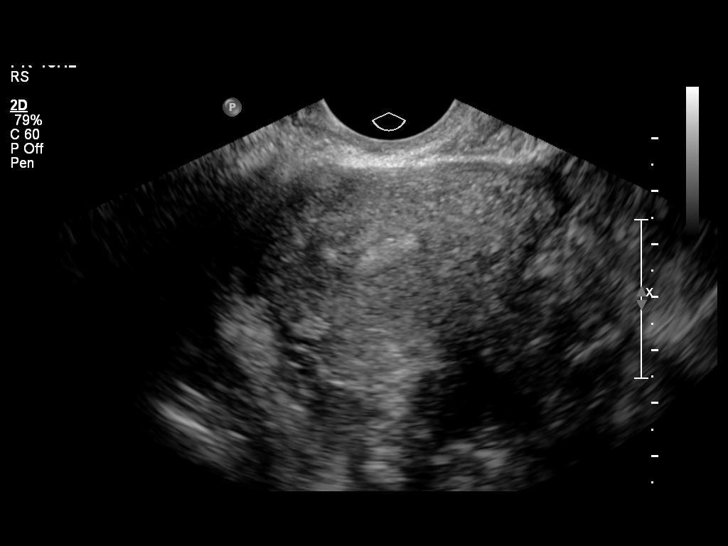
[im 28/42]
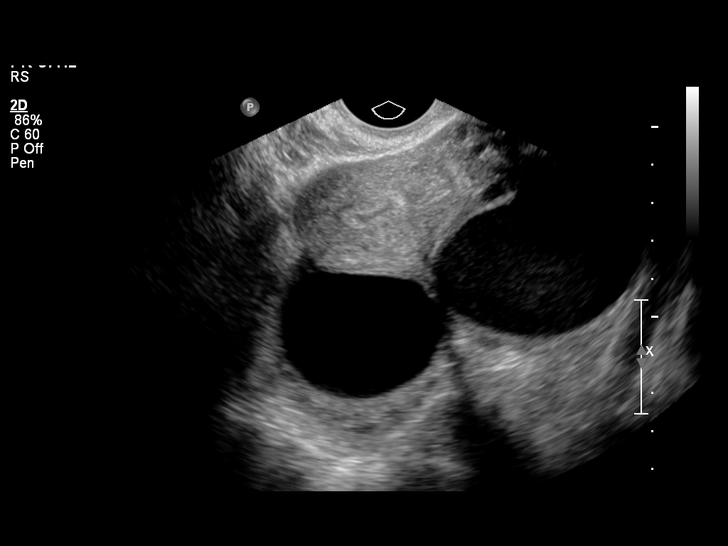
[im 31/42]
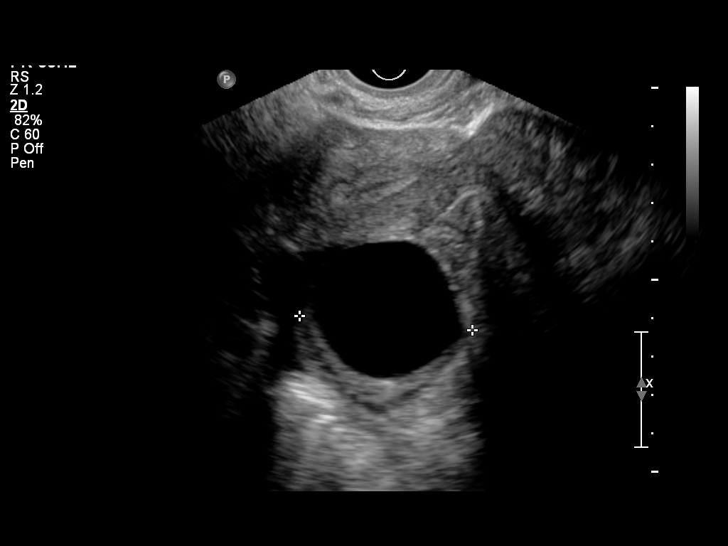
[im 34/42]
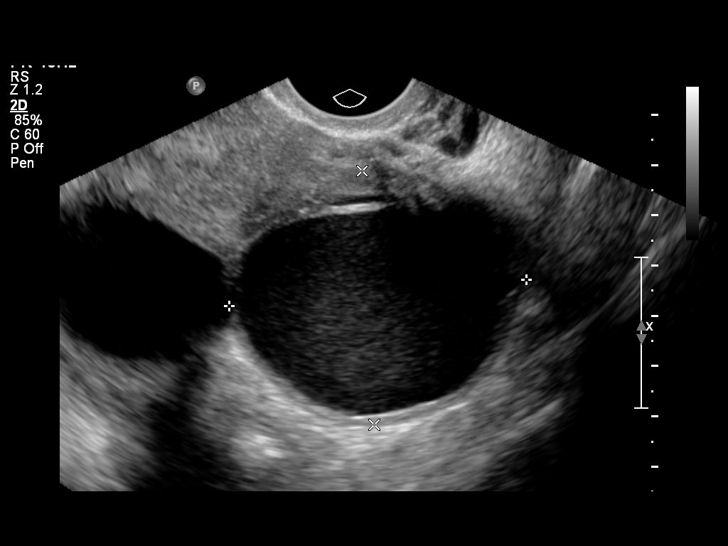
[im 37/42]
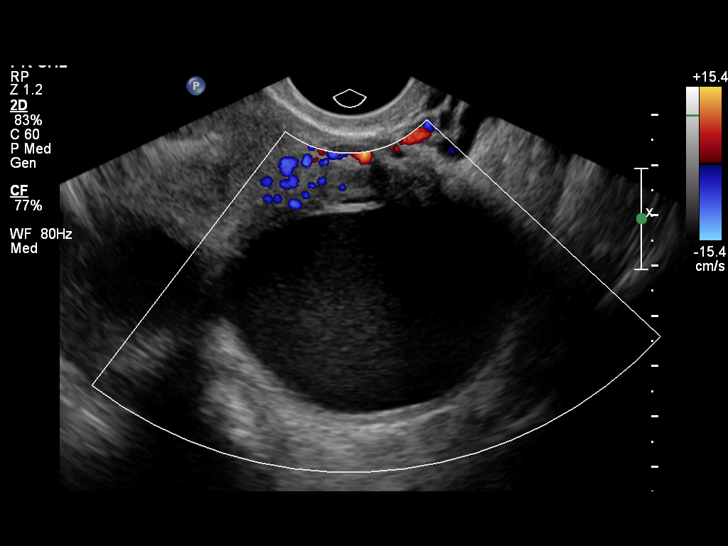
[im 40/42]
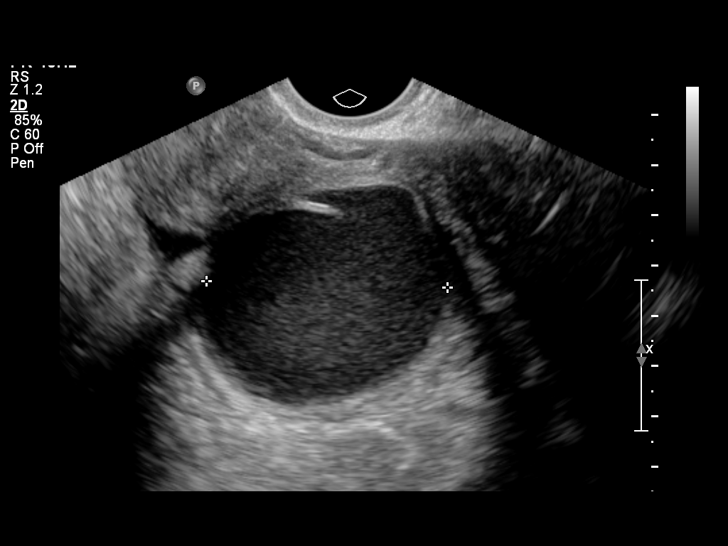

[13 of 28 positions shown; findings below may reference images not displayed]

Intrauterine gestational sac:  No intrauterine gestational sac is
demonstrated.  The endometrial stripe thickness is normal,
measuring about 10 mm.
Yolk sac: Yolk sac is not visualized.
Embryo: The fetal pole is not visualized.
Cardiac Activity: Fetal cardiac activity is not visualized.

Maternal uterus/adnexae:
The uterus is anteverted.  No myometrial masses.  Cervix and
endocervical canal are unremarkable.

The right ovary measures 4.7 x 4.6 x 4.5 cm.  There is a simple
appearing cyst in the right ovary measuring 4.3 x 3.7 x 3.8 cm.
The left ovary measures 5.5 x 4.3 x 5.6 cm.  There is a complex
appearing cyst with thin well-circumscribed walls and increased
echotexture diffusely measuring 5.5 x 4.3 x 4.8 cm.  This likely
represents a hemorrhagic cyst.  No abnormal flow is demonstrated
within the cyst.  No free pelvic fluid collections.
IMPRESSION: No intrauterine gestational sac is visualized, possibly due to
early stage of pregnancy. Previous spontaneous abortion or occult
ectopic pregnancy are not entirely excluded.  Recommend follow-up
with serial quantitative beta HCG levels and / or follow-up
ultrasound in 2-4 weeks for further evaluation.  Simple cyst in the
right ovary consistent with functional cyst.  Complex cyst in the
left ovary likely representing hemorrhagic cyst or endometrioma.
Follow-up in 6-12 weeks is recommended.

## 2014-10-02 ENCOUNTER — Encounter (HOSPITAL_BASED_OUTPATIENT_CLINIC_OR_DEPARTMENT_OTHER): Payer: Self-pay | Admitting: *Deleted

## 2014-10-02 ENCOUNTER — Emergency Department (HOSPITAL_BASED_OUTPATIENT_CLINIC_OR_DEPARTMENT_OTHER): Payer: No Typology Code available for payment source

## 2014-10-02 ENCOUNTER — Emergency Department (HOSPITAL_BASED_OUTPATIENT_CLINIC_OR_DEPARTMENT_OTHER)
Admission: EM | Admit: 2014-10-02 | Discharge: 2014-10-02 | Discharge status: Against Medical Advice | Payer: No Typology Code available for payment source | Attending: Emergency Medicine | Admitting: Emergency Medicine

## 2014-10-02 DIAGNOSIS — Z872 Personal history of diseases of the skin and subcutaneous tissue: Secondary | ICD-10-CM | POA: Insufficient documentation

## 2014-10-02 DIAGNOSIS — Y92410 Unspecified street and highway as the place of occurrence of the external cause: Secondary | ICD-10-CM | POA: Diagnosis not present

## 2014-10-02 DIAGNOSIS — Y998 Other external cause status: Secondary | ICD-10-CM | POA: Insufficient documentation

## 2014-10-02 DIAGNOSIS — S4992XA Unspecified injury of left shoulder and upper arm, initial encounter: Secondary | ICD-10-CM | POA: Diagnosis present

## 2014-10-02 DIAGNOSIS — Z8709 Personal history of other diseases of the respiratory system: Secondary | ICD-10-CM | POA: Diagnosis not present

## 2014-10-02 DIAGNOSIS — Z8619 Personal history of other infectious and parasitic diseases: Secondary | ICD-10-CM | POA: Diagnosis not present

## 2014-10-02 DIAGNOSIS — Y9389 Activity, other specified: Secondary | ICD-10-CM | POA: Diagnosis not present

## 2014-10-02 DIAGNOSIS — Z720 Tobacco use: Secondary | ICD-10-CM | POA: Insufficient documentation

## 2014-10-02 MED ORDER — NAPROXEN 250 MG PO TABS: 500.0000 mg | ORAL_TABLET | Freq: Once | ORAL | Status: DC
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
## 2014-10-02 NOTE — ED Notes (Signed)
Ice pack to Left shoulder for pt comfort
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
## 2014-10-02 NOTE — ED Notes (Signed)
Pt up to bathroom, informed pt, that after returning from restroom she would be given pain med and taken to x-ray, went to room to escort pt to radiology, pt could not be found, checked other restrooms and outside for pt, Consulting civil engineerCharge RN notified of pt leaving

## 2014-11-20 ENCOUNTER — Encounter (HOSPITAL_BASED_OUTPATIENT_CLINIC_OR_DEPARTMENT_OTHER): Payer: Self-pay | Admitting: *Deleted

## 2014-11-20 ENCOUNTER — Emergency Department (HOSPITAL_BASED_OUTPATIENT_CLINIC_OR_DEPARTMENT_OTHER)
Admission: EM | Admit: 2014-11-20 | Discharge: 2014-11-20 | Disposition: A | Discharge status: Home / Self Care | Payer: Self-pay | Attending: Emergency Medicine | Admitting: Emergency Medicine

## 2014-11-20 DIAGNOSIS — N760 Acute vaginitis: Secondary | ICD-10-CM | POA: Insufficient documentation

## 2014-11-20 DIAGNOSIS — Z872 Personal history of diseases of the skin and subcutaneous tissue: Secondary | ICD-10-CM | POA: Insufficient documentation

## 2014-11-20 DIAGNOSIS — Z8619 Personal history of other infectious and parasitic diseases: Secondary | ICD-10-CM | POA: Insufficient documentation

## 2014-11-20 DIAGNOSIS — Z720 Tobacco use: Secondary | ICD-10-CM | POA: Insufficient documentation

## 2014-11-20 DIAGNOSIS — Z3202 Encounter for pregnancy test, result negative: Secondary | ICD-10-CM | POA: Insufficient documentation

## 2014-11-20 LAB — WET PREP, GENITAL
Trich, Wet Prep: NONE SEEN
YEAST WET PREP: NONE SEEN

## 2014-11-20 LAB — URINALYSIS, ROUTINE W REFLEX MICROSCOPIC
Bilirubin Urine: NEGATIVE
GLUCOSE, UA: NEGATIVE mg/dL
Hgb urine dipstick: NEGATIVE
Ketones, ur: NEGATIVE mg/dL
Nitrite: NEGATIVE
PH: 7 (ref 5.0–8.0)
PROTEIN: NEGATIVE mg/dL
Specific Gravity, Urine: 1.025 (ref 1.005–1.030)
UROBILINOGEN UA: 1 mg/dL (ref 0.0–1.0)

## 2014-11-20 LAB — PREGNANCY, URINE: Preg Test, Ur: NEGATIVE

## 2014-11-20 LAB — URINE MICROSCOPIC-ADD ON

## 2014-11-20 MED ORDER — METRONIDAZOLE 500 MG PO TABS: 500.0000 mg | ORAL_TABLET | Freq: Two times a day (BID) | ORAL | Status: DC

## 2014-11-20 MED ORDER — VALACYCLOVIR HCL 1 G PO TABS: 500.0000 mg | ORAL_TABLET | Freq: Two times a day (BID) | ORAL | Status: AC

## 2014-11-20 NOTE — ED Provider Notes (Signed)
CSN: [id number]     Arrival date & time 11/20/14  1436 History   First MD Initiated Contact with Patient 11/20/14 1443     Chief Complaint  Patient presents with  . Vaginal Pain     (Consider location/radiation/quality/duration/timing/severity/associated sxs/prior Treatment) HPI Jennifer Roberson is a 24 y.o. female with a history of genital herpes comes in for evaluation of vaginal pain. Patient states she was diagnosed you herpes every year ago, but is not taking any medications for this. She reports about a week ago she started to experience pain similar to her herpetic outbreak year ago, but milder. She also reports using new bath soap. Nothing seems to make this problem better, but using pads and contact with anything else exacerbates the problems. Reports mild pelvic pain that is intermittent. She denies any fevers, chills, over abdominal pain, vaginal bleeding or discharge. No other aggravating or modifying factors.  Past Medical History  Diagnosis Date  . Eczema   . Trichomonas   . Miscarriage   . Seasonal allergies    Past Surgical History  Procedure Laterality Date  . No past surgeries     Family History  Problem Relation Age of Onset  . Hypertension Maternal Grandfather   . Diabetes Maternal Grandfather   . Asthma Father    History  Substance Use Topics  . Smoking status: Current Every Day Smoker -- 0.50 packs/day    Types: Cigarettes  . Smokeless tobacco: Never Used  . Alcohol Use: Yes     Comment: socially   OB History    Gravida Para Term Preterm AB TAB SAB Ectopic Multiple Living   0    0  0        Review of Systems A 10 point review of systems was completed and was negative except for pertinent positives and negatives as mentioned in the history of present illness     Allergies  Review of patient's allergies indicates no known allergies.  Home Medications   Prior to Admission medications   Medication Sig Start Date End Date Taking? Authorizing  Provider  metroNIDAZOLE (FLAGYL) 500 MG tablet Take 1 tablet (500 mg total) by mouth 2 (two) times daily. 11/20/14   Joycie Peek, PA-C  valACYclovir (VALTREX) 1000 MG tablet Take 0.5 tablets (500 mg total) by mouth 2 (two) times daily. 11/20/14 12/04/14  Paidyn Mcferran, PA-C   BP 151/92 mmHg  Pulse 76  Temp(Src) 98.6 F (37 C) (Oral)  Ht  (1.753 m)  Wt 175 lb (79.379 kg)  BMI 25.83 kg/m2  SpO2 100%  LMP 10/05/2014 Physical Exam  Constitutional: She is oriented to person, place, and time. She appears well-developed and well-nourished.  HENT:  Head: Normocephalic and atraumatic.  Mouth/Throat: Oropharynx is clear and moist.  Eyes: Conjunctivae are normal. Pupils are equal, round, and reactive to light. Right eye exhibits no discharge. Left eye exhibits no discharge. No scleral icterus.  Neck: Neck supple.  Cardiovascular: Normal rate, regular rhythm and normal heart sounds.   Pulmonary/Chest: Effort normal and breath sounds normal. No respiratory distress. She has no wheezes. She has no rales.  Abdominal: Soft. There is no tenderness.  Genitourinary:  Chaperone was present for the entire genital exam. Vulva with diffuse erythema, small maculopapular satellite lesions. No overt vesicles noted. Cervix visualized on speculum exam and appropriate cultures sampled. Scant blood in vaginal vault. Discharge: Scant white Upon bi manual exam- No TTP of the adnexa, no cervical motion tenderness. No fullness or masses appreciated.  No abnormalities appreciated in structural anatomy.   Musculoskeletal: She exhibits no tenderness.  Neurological: She is alert and oriented to person, place, and time.  Cranial Nerves II-XII grossly intact  Skin: Skin is warm and dry. No rash noted.  Psychiatric: She has a normal mood and affect.  Nursing note and vitals reviewed.   ED Course  Procedures (including critical care time) Labs Review Labs Reviewed  WET PREP, GENITAL - Abnormal; Notable for  the following:    Clue Cells Wet Prep HPF POC MANY (*)    WBC, Wet Prep HPF POC MANY (*)    All other components within normal limits  URINALYSIS, ROUTINE W REFLEX MICROSCOPIC (NOT AT [facility]) - Abnormal; Notable for the following:    Leukocytes, UA SMALL (*)    All other components within normal limits  PREGNANCY, URINE  URINE MICROSCOPIC-ADD ON  HIV ANTIBODY (ROUTINE TESTING)  GC/CHLAMYDIA PROBE AMP ([place]) NOT AT [facility]    Imaging Review No results found.   EKG Interpretation None     Meds given in ED:  Medications - No data to display  New Prescriptions   METRONIDAZOLE (FLAGYL) 500 MG TABLET    Take 1 tablet (500 mg total) by mouth 2 (two) times daily.   VALACYCLOVIR (VALTREX) 1000 MG TABLET    Take 0.5 tablets (500 mg total) by mouth 2 (two) times daily.   Filed Vitals:   11/20/14 1440  BP: 151/92  Pulse: 76  Temp: 98.6 F (37 C)  TempSrc: Oral  Height: 5\' 9"  (1.753 m)  Weight: 175 lb (79.379 kg)  SpO2: 100%    MDM  Vitals stable - WNL -afebrile Pt resting comfortably in ED. PE--physical exam as mentioned and is most consistent with vaginitis Labwork--many clue cells and wbc's on wet prep. No evidence of UTI.  DDX--patient with probable vaginitis secondary to new soap. Potential outbreak of herpes. Will treat with valacyclovir and encouraged symptomatic relief at home for vaginitis. Metronidazole for BV. Many WBCs likely secondary to the inflammatory response secondary to herpes. Low concern for other intra-abdominal or pelvic infection. No evidence of other acute or emergent pathology at this time.  I discussed all relevant lab findings and imaging results with pt and they verbalized understanding. Discussed f/u with PCP within 48 hrs and return precautions, pt very amenable to plan.  Final diagnoses:  Vaginitis       Joycie Peek, PA-C 11/20/14 1558  Gwyneth Sprout, MD 11/21/14 [phone]

## 2014-11-20 NOTE — ED Notes (Signed)
Vaginal pain with thick white discharge.

## 2014-11-20 NOTE — Discharge Instructions (Signed)
Bacterial Vaginosis °Bacterial vaginosis is an infection of the vagina. It happens when too many of certain germs (bacteria) grow in the vagina. °HOME CARE °· Take your medicine as told by your doctor. °· Finish your medicine even if you start to feel better. °· Do not have sex until you finish your medicine and are better. °· Tell your sex partner that you have an infection. They should see their doctor for treatment. °· Practice safe sex. Use condoms. Have only one sex partner. °GET HELP IF: °· You are not getting better after 3 days of treatment. °· You have more grey fluid (discharge) coming from your vagina than before. °· You have more pain than before. °· You have a fever. °MAKE SURE YOU:  °· Understand these instructions. °· Will watch your condition. °· Will get help right away if you are not doing well or get worse. °Document Released: 03/01/2008 Document Revised: 03/13/2013 Document Reviewed: 01/02/2013 °ExitCare® Patient Information ©2015 [employer]. This information is not intended to replace advice given to you by your health care provider. Make sure you discuss any questions you have with your health care provider. ° °Vaginitis °Vaginitis is an inflammation of the vagina. It is most often caused by a change in the normal balance of the bacteria and yeast that live in the vagina. This change in balance causes an overgrowth of certain bacteria or yeast, which causes the inflammation. There are different types of vaginitis, but the most common types are: °· Bacterial vaginosis. °· Yeast infection (candidiasis). °· Trichomoniasis vaginitis. This is a sexually transmitted infection (STI). °· Viral vaginitis. °· Atropic vaginitis. °· Allergic vaginitis. °CAUSES  °The cause depends on the type of vaginitis. Vaginitis can be caused by: °· Bacteria (bacterial vaginosis). °· Yeast (yeast infection). °· A parasite (trichomoniasis vaginitis) °· A virus (viral vaginitis). °· Low hormone levels (atrophic  vaginitis). Low hormone levels can occur during pregnancy, breastfeeding, or after menopause. °· Irritants, such as bubble baths, scented tampons, and feminine sprays (allergic vaginitis). °Other factors can change the normal balance of the yeast and bacteria that live in the vagina. These include: °· Antibiotic medicines. °· Poor hygiene. °· Diaphragms, vaginal sponges, spermicides, birth control pills, and intrauterine devices (IUD). °· Sexual intercourse. °· Infection. °· Uncontrolled diabetes. °· A weakened immune system. °SYMPTOMS  °Symptoms can vary depending on the cause of the vaginitis. Common symptoms include: °· Abnormal vaginal discharge. °¨ The discharge is white, gray, or yellow with bacterial vaginosis. °¨ The discharge is thick, white, and cheesy with a yeast infection. °¨ The discharge is frothy and yellow or greenish with trichomoniasis. °· A bad vaginal odor. °¨ The odor is fishy with bacterial vaginosis. °· Vaginal itching, pain, or swelling. °· Painful intercourse. °· Pain or burning when urinating. °Sometimes, there are no symptoms. °TREATMENT  °Treatment will vary depending on the type of infection.  °· Bacterial vaginosis and trichomoniasis are often treated with antibiotic creams or pills. °· Yeast infections are often treated with antifungal medicines, such as vaginal creams or suppositories. °· Viral vaginitis has no cure, but symptoms can be treated with medicines that relieve discomfort. Your sexual partner should be treated as well. °· Atrophic vaginitis may be treated with an estrogen cream, pill, suppository, or vaginal ring. If vaginal dryness occurs, lubricants and moisturizing creams may help. You may be told to avoid scented soaps, sprays, or douches. °· Allergic vaginitis treatment involves quitting the use of the product that is causing the problem. Vaginal creams   can be used to treat the symptoms. °HOME CARE INSTRUCTIONS  °· Take all medicines as directed by your  caregiver. °· Keep your genital area clean and dry. Avoid soap and only rinse the area with water. °· Avoid douching. It can remove the healthy bacteria in the vagina. °· Do not use tampons or have sexual intercourse until your vaginitis has been treated. Use sanitary pads while you have vaginitis. °· Wipe from front to back. This avoids the spread of bacteria from the rectum to the vagina. °· Let air reach your genital area. °¨ Wear cotton underwear to decrease moisture buildup. °¨ Avoid wearing underwear while you sleep until your vaginitis is gone. °¨ Avoid tight pants and underwear or nylons without a cotton panel. °¨ Take off wet clothing (especially bathing suits) as soon as possible. °· Use mild, non-scented products. Avoid using irritants, such as: °¨ Scented feminine sprays. °¨ Fabric softeners. °¨ Scented detergents. °¨ Scented tampons. °¨ Scented soaps or bubble baths. °· Practice safe sex and use condoms. Condoms may prevent the spread of trichomoniasis and viral vaginitis. °SEEK MEDICAL CARE IF:  °· You have abdominal pain. °· You have a fever or persistent symptoms for more than 2-3 days. °· You have a fever and your symptoms suddenly get worse. °Document Released: 03/20/2007 Document Revised: 02/15/2012 Document Reviewed: 11/03/2011 °ExitCare® Patient Information ©2015 [employer]. This information is not intended to replace advice given to you by your health care provider. Make sure you discuss any questions you have with your health care provider. ° °

## 2014-11-21 LAB — GC/CHLAMYDIA PROBE AMP (~~LOC~~) NOT AT ARMC
Chlamydia: POSITIVE — AB
Neisseria Gonorrhea: NEGATIVE

## 2014-11-21 LAB — HIV ANTIBODY (ROUTINE TESTING W REFLEX): HIV SCREEN 4TH GENERATION: NONREACTIVE

## 2014-11-24 ENCOUNTER — Telehealth (HOSPITAL_BASED_OUTPATIENT_CLINIC_OR_DEPARTMENT_OTHER): Payer: Self-pay | Admitting: Emergency Medicine

## 2014-11-24 NOTE — Telephone Encounter (Signed)
+   Chlamydia, chart handoff to EDP for treatment plan

## 2014-11-26 NOTE — Telephone Encounter (Signed)
Post ED Visit - Positive Culture Follow-up: Successful Patient Follow-Up  Culture assessed and recommendations reviewed by: []  Celedonio Miyamoto, Pharm.D., BCPS-AQ ID []  Georgina Pillion, Pharm.D., BCPS []  [place], [place].D., BCPS, AAHIVP []  Estella Husk, Pharm.D., BCPS, AAHIVP []  Tegan Magsam, Pharm.D. []  Tennis Must, Pharm.D.  Positive chlamydia culture  [x]  Patient discharged without antimicrobial prescription and treatment is now indicated []  Organism is resistant to prescribed ED discharge antimicrobial []  Patient with positive blood cultures  Changes discussed with ED provider: Dr. Ethelda Chick New antibiotic prescription Zithromax 1 gm po x 1 Called to [facility] [place] [phone]  Contacted patient, date 11/26/14 [phone]   Berle Mull 11/26/2014, 12:23 PM

## 2015-02-05 ENCOUNTER — Encounter (HOSPITAL_BASED_OUTPATIENT_CLINIC_OR_DEPARTMENT_OTHER): Payer: Self-pay

## 2015-02-05 ENCOUNTER — Emergency Department (HOSPITAL_BASED_OUTPATIENT_CLINIC_OR_DEPARTMENT_OTHER)
Admission: EM | Admit: 2015-02-05 | Discharge: 2015-02-05 | Disposition: A | Discharge status: Home / Self Care | Payer: Self-pay | Attending: Emergency Medicine | Admitting: Emergency Medicine

## 2015-02-05 DIAGNOSIS — N898 Other specified noninflammatory disorders of vagina: Secondary | ICD-10-CM | POA: Insufficient documentation

## 2015-02-05 DIAGNOSIS — Z720 Tobacco use: Secondary | ICD-10-CM | POA: Insufficient documentation

## 2015-02-05 DIAGNOSIS — Z872 Personal history of diseases of the skin and subcutaneous tissue: Secondary | ICD-10-CM | POA: Insufficient documentation

## 2015-02-05 DIAGNOSIS — Z3202 Encounter for pregnancy test, result negative: Secondary | ICD-10-CM | POA: Insufficient documentation

## 2015-02-05 DIAGNOSIS — Z8619 Personal history of other infectious and parasitic diseases: Secondary | ICD-10-CM | POA: Insufficient documentation

## 2015-02-05 DIAGNOSIS — Z202 Contact with and (suspected) exposure to infections with a predominantly sexual mode of transmission: Secondary | ICD-10-CM | POA: Insufficient documentation

## 2015-02-05 LAB — WET PREP, GENITAL
Clue Cells Wet Prep HPF POC: NONE SEEN
TRICH WET PREP: NONE SEEN
YEAST WET PREP: NONE SEEN

## 2015-02-05 LAB — URINALYSIS, ROUTINE W REFLEX MICROSCOPIC
Bilirubin Urine: NEGATIVE
GLUCOSE, UA: NEGATIVE mg/dL
Hgb urine dipstick: NEGATIVE
Ketones, ur: NEGATIVE mg/dL
Nitrite: NEGATIVE
PH: 8 (ref 5.0–8.0)
Protein, ur: NEGATIVE mg/dL
Specific Gravity, Urine: 1.025 (ref 1.005–1.030)
Urobilinogen, UA: 1 mg/dL (ref 0.0–1.0)

## 2015-02-05 LAB — URINE MICROSCOPIC-ADD ON

## 2015-02-05 LAB — PREGNANCY, URINE: Preg Test, Ur: NEGATIVE

## 2015-02-05 MED ORDER — AZITHROMYCIN 250 MG PO TABS
1000.0000 mg | ORAL_TABLET | Freq: Once | ORAL | Status: AC
  Administered 2015-02-05: 1000 mg via ORAL
  Filled 2015-02-05: qty 4

## 2015-02-05 MED ORDER — CEFTRIAXONE SODIUM 250 MG IJ SOLR
250.0000 mg | Freq: Once | INTRAMUSCULAR | Status: AC
  Administered 2015-02-05: 250 mg via INTRAMUSCULAR
  Filled 2015-02-05: qty 250

## 2015-02-05 NOTE — ED Notes (Signed)
STD exposure-positive vaginal d/c x 4 days-pt NAD

## 2015-02-05 NOTE — Discharge Instructions (Signed)

## 2015-02-05 NOTE — ED Notes (Signed)
Nurse first-pt seated in ED WR-NAD 

## 2015-02-05 NOTE — ED Provider Notes (Signed)
CSN: [id number]     Arrival date & time 02/05/15  1842 History  This chart was scribed for Glynn Octave, MD by Gwenyth Ober, ED Scribe. This patient was seen in room MH01/MH01 and the patient's care was started at 10:06 PM.    Chief Complaint  Patient presents with  . Exposure to STD   The history is provided by the patient. No language interpreter was used.    HPI Comments: Jennifer Roberson is a 24 y.o. female with a history of chlamydia who presents to the Emergency Department complaining of malodorous, moderate vaginal discharge that started 3-4 days ago. She states mild abdominal pain as an associated symptom. Pt has 1 partner who recently tested positive for chlamydia. Pt was seen in the ED on 6/20 for similar symptoms and was diagnosed with chlamydia, which was treated with Zithromax. Her boyfriend tested negative immediately after the visit, but had a positive test in the last few days. Pt denies hematuria, fever, vomiting and dysuria as associated symptoms.  Past Medical History  Diagnosis Date  . Eczema   . Trichomonas   . Miscarriage   . Seasonal allergies    Past Surgical History  Procedure Laterality Date  . No past surgeries     Family History  Problem Relation Age of Onset  . Hypertension Maternal Grandfather   . Diabetes Maternal Grandfather   . Asthma Father    Social History  Substance Use Topics  . Smoking status: Current Every Day Smoker -- 0.50 packs/day    Types: Cigarettes  . Smokeless tobacco: Never Used  . Alcohol Use: Yes     Comment: socially   OB History    Gravida Para Term Preterm AB TAB SAB Ectopic Multiple Living   0    0  0        Review of Systems  Constitutional: Negative for fever.  Gastrointestinal: Positive for abdominal pain. Negative for vomiting.  Genitourinary: Positive for vaginal discharge. Negative for dysuria and hematuria.  All other systems reviewed and are negative.   Allergies  Review of patient's allergies  indicates no known allergies.  Home Medications   Prior to Admission medications   Not on File   BP 163/98 mmHg  Pulse 64  Temp(Src) 98.4 F (36.9 C) (Oral)  Resp 18  Ht 5\' 9"  (1.753 m)  Wt 175 lb (79.379 kg)  BMI 25.83 kg/m2  SpO2 100%  LMP 01/30/2015 Physical Exam  Constitutional: She is oriented to person, place, and time. She appears well-developed and well-nourished. No distress.  HENT:  Head: Normocephalic and atraumatic.  Mouth/Throat: Oropharynx is clear and moist. No oropharyngeal exudate.  Eyes: Conjunctivae and EOM are normal. Pupils are equal, round, and reactive to light.  Neck: Normal range of motion. Neck supple.  No meningismus.  Cardiovascular: Normal rate, regular rhythm, normal heart sounds and intact distal pulses.   No murmur heard. Pulmonary/Chest: Effort normal and breath sounds normal. No respiratory distress.  Abdominal: Soft. There is no tenderness. There is no rebound and no guarding.  Genitourinary:  Female nurse chaperone present White discharge Normal external genitalia No CMT or adnexal tenderness  Musculoskeletal: Normal range of motion. She exhibits no edema or tenderness.  Neurological: She is alert and oriented to person, place, and time. No cranial nerve deficit. She exhibits normal muscle tone. Coordination normal.  No ataxia on finger to nose bilaterally. No pronator drift. 5/5 strength throughout. CN 2-12 intact. Negative Romberg. Equal grip strength. Sensation intact. Gait  is normal.   Skin: Skin is warm.  Psychiatric: She has a normal mood and affect. Her behavior is normal.  Nursing note and vitals reviewed.   ED Course  Procedures   DIAGNOSTIC STUDIES: Oxygen Saturation is 100% on RA, normal by my interpretation.    COORDINATION OF CARE: 10:11 PM Discussed treatment plan with pt which includes a pelvic exam. Pt agreed to plan.   Labs Review Labs Reviewed  WET PREP, GENITAL - Abnormal; Notable for the following:    WBC,  Wet Prep HPF POC FEW (*)    All other components within normal limits  URINALYSIS, ROUTINE W REFLEX MICROSCOPIC (NOT AT [facility]) - Abnormal; Notable for the following:    APPearance CLOUDY (*)    Leukocytes, UA SMALL (*)    All other components within normal limits  URINE MICROSCOPIC-ADD ON - Abnormal; Notable for the following:    Squamous Epithelial / LPF FEW (*)    All other components within normal limits  PREGNANCY, URINE  GC/CHLAMYDIA PROBE AMP ([place]) NOT AT [facility]    Imaging Review No results found.   EKG Interpretation None      MDM   Final diagnoses:  STD exposure  Vaginal discharge   Vaginal discharge x 3 days with exposure to chlamydia.  Abdomen soft and pelvic exam benign.  UA negative. HCG negative.  Empiric treatment for GC and chlamydia.  Wet prep reassuring. Safe sex practices discussed.   Glynn Octave, MD 02/06/15 [phone]

## 2015-02-06 LAB — GC/CHLAMYDIA PROBE AMP (~~LOC~~) NOT AT ARMC
CHLAMYDIA, DNA PROBE: NEGATIVE
Neisseria Gonorrhea: NEGATIVE

## 2015-04-30 ENCOUNTER — Emergency Department (HOSPITAL_BASED_OUTPATIENT_CLINIC_OR_DEPARTMENT_OTHER)
Admission: EM | Admit: 2015-04-30 | Discharge: 2015-04-30 | Disposition: A | Discharge status: Home / Self Care | Payer: Self-pay | Attending: Emergency Medicine | Admitting: Emergency Medicine

## 2015-04-30 ENCOUNTER — Encounter (HOSPITAL_BASED_OUTPATIENT_CLINIC_OR_DEPARTMENT_OTHER): Payer: Self-pay

## 2015-04-30 DIAGNOSIS — Z872 Personal history of diseases of the skin and subcutaneous tissue: Secondary | ICD-10-CM | POA: Insufficient documentation

## 2015-04-30 DIAGNOSIS — Z8709 Personal history of other diseases of the respiratory system: Secondary | ICD-10-CM | POA: Insufficient documentation

## 2015-04-30 DIAGNOSIS — B3731 Acute candidiasis of vulva and vagina: Secondary | ICD-10-CM

## 2015-04-30 DIAGNOSIS — B373 Candidiasis of vulva and vagina: Secondary | ICD-10-CM | POA: Insufficient documentation

## 2015-04-30 DIAGNOSIS — Z3202 Encounter for pregnancy test, result negative: Secondary | ICD-10-CM | POA: Insufficient documentation

## 2015-04-30 DIAGNOSIS — N390 Urinary tract infection, site not specified: Secondary | ICD-10-CM | POA: Insufficient documentation

## 2015-04-30 DIAGNOSIS — F17210 Nicotine dependence, cigarettes, uncomplicated: Secondary | ICD-10-CM | POA: Insufficient documentation

## 2015-04-30 LAB — URINALYSIS, ROUTINE W REFLEX MICROSCOPIC
Bilirubin Urine: NEGATIVE
GLUCOSE, UA: NEGATIVE mg/dL
Hgb urine dipstick: NEGATIVE
Ketones, ur: NEGATIVE mg/dL
Nitrite: NEGATIVE
PH: 5.5 (ref 5.0–8.0)
Protein, ur: NEGATIVE mg/dL
Specific Gravity, Urine: 1.024 (ref 1.005–1.030)

## 2015-04-30 LAB — URINE MICROSCOPIC-ADD ON

## 2015-04-30 LAB — WET PREP, GENITAL
SPERM: NONE SEEN
TRICH WET PREP: NONE SEEN
YEAST WET PREP: NONE SEEN

## 2015-04-30 LAB — PREGNANCY, URINE: Preg Test, Ur: NEGATIVE

## 2015-04-30 MED ORDER — SULFAMETHOXAZOLE-TRIMETHOPRIM 800-160 MG PO TABS
1.0000 | ORAL_TABLET | Freq: Once | ORAL | Status: AC
  Administered 2015-04-30: 1 via ORAL
  Filled 2015-04-30: qty 1

## 2015-04-30 MED ORDER — FLUCONAZOLE 50 MG PO TABS
150.0000 mg | ORAL_TABLET | Freq: Once | ORAL | Status: AC
  Administered 2015-04-30: 150 mg via ORAL
  Filled 2015-04-30: qty 1

## 2015-04-30 MED ORDER — VALACYCLOVIR HCL 1 G PO TABS: 1000.0000 mg | ORAL_TABLET | Freq: Three times a day (TID) | ORAL | Status: DC

## 2015-04-30 MED ORDER — FLUCONAZOLE 150 MG PO TABS: ORAL_TABLET | ORAL | Status: DC

## 2015-04-30 MED ORDER — SULFAMETHOXAZOLE-TRIMETHOPRIM 800-160 MG PO TABS: 2.0000 | ORAL_TABLET | Freq: Two times a day (BID) | ORAL | Status: DC

## 2015-04-30 NOTE — ED Notes (Signed)
Pelvic cart set up at bedside  

## 2015-04-30 NOTE — Discharge Instructions (Signed)
Urinary Tract Infection °A urinary tract infection (UTI) can occur any place along the urinary tract. The tract includes the kidneys, ureters, bladder, and urethra. A type of germ called bacteria often causes a UTI. UTIs are often helped with antibiotic medicine.  °HOME CARE  °· If given, take antibiotics as told by your doctor. Finish them even if you start to feel better. °· Drink enough fluids to keep your pee (urine) clear or pale yellow. °· Avoid tea, drinks with caffeine, and bubbly (carbonated) drinks. °· Pee often. Avoid holding your pee in for a long time. °· Pee before and after having sex (intercourse). °· Wipe from front to back after you poop (bowel movement) if you are a woman. Use each tissue only once. °GET HELP RIGHT AWAY IF:  °· You have back pain. °· You have lower belly (abdominal) pain. °· You have chills. °· You feel sick to your stomach (nauseous). °· You throw up (vomit). °· Your burning or discomfort with peeing does not go away. °· You have a fever. °· Your symptoms are not better in 3 days. °MAKE SURE YOU:  °· Understand these instructions. °· Will watch your condition. °· Will get help right away if you are not doing well or get worse. °  °This information is not intended to replace advice given to you by your health care provider. Make sure you discuss any questions you have with your health care provider. °  °Document Released: 11/09/2007 Document Revised: 06/13/2014 Document Reviewed: 12/22/2011 °Elsevier Interactive Patient Education ©2016 [employer] °Monilial Vaginitis °Vaginitis in a soreness, swelling and redness (inflammation) of the vagina and vulva. Monilial vaginitis is not a sexually transmitted infection. °CAUSES  °Yeast vaginitis is caused by yeast (candida) that is normally found in your vagina. With a yeast infection, the candida has overgrown in number to a point that upsets the chemical balance. °SYMPTOMS  °White, thick vaginal discharge. °Swelling, itching, redness  and irritation of the vagina and possibly the lips of the vagina (vulva). °Burning or painful urination. °Painful intercourse. °DIAGNOSIS  °Things that may contribute to monilial vaginitis are: °Postmenopausal and virginal states. °Pregnancy. °Infections. °Being tired, sick or stressed, especially if you had monilial vaginitis in the past. °Diabetes. Good control will help lower the chance. °Birth control pills. °Tight fitting garments. °Using bubble bath, feminine sprays, douches or deodorant tampons. °Taking certain medications that kill germs (antibiotics). °Sporadic recurrence can occur if you become ill. °TREATMENT  °Your caregiver will give you medication. °There are several kinds of anti monilial vaginal creams and suppositories specific for monilial vaginitis. For recurrent yeast infections, use a suppository or cream in the vagina 2 times a week, or as directed. °Anti-monilial or steroid cream for the itching or irritation of the vulva may also be used. Get your caregiver's permission. °Painting the vagina with methylene blue solution may help if the monilial cream does not work. °Eating yogurt may help prevent monilial vaginitis. °HOME CARE INSTRUCTIONS  °Finish all medication as prescribed. °Do not have sex until treatment is completed or after your caregiver tells you it is okay. °Take warm sitz baths. °Do not douche. °Do not use tampons, especially scented ones. °Wear cotton underwear. °Avoid tight pants and panty hose. °Tell your sexual partner that you have a yeast infection. They should go to their caregiver if they have symptoms such as mild rash or itching. °Your sexual partner should be treated as well if your infection is difficult to eliminate. °Practice safer sex. Use condoms. °  Some vaginal medications cause latex condoms to fail. Vaginal medications that harm condoms are: °Cleocin cream. °Butoconazole (Femstat®). °Terconazole (Terazol®) vaginal suppository. °Miconazole (Monistat®) (may be  purchased over the counter). °SEEK MEDICAL CARE IF:  °You have a temperature by mouth above 102° F (38.9° C). °The infection is getting worse after 2 days of treatment. °The infection is not getting better after 3 days of treatment. °You develop blisters in or around your vagina. °You develop vaginal bleeding, and it is not your menstrual period. °You have pain when you urinate. °You develop intestinal problems. °You have pain with sexual intercourse. °  °This information is not intended to replace advice given to you by your health care provider. Make sure you discuss any questions you have with your health care provider. °  °Document Released: 03/02/2005 Document Revised: 08/15/2011 Document Reviewed: 11/24/2014 °Elsevier Interactive Patient Education ©2016 Elsevier Inc. ° °

## 2015-04-30 NOTE — ED Provider Notes (Signed)
CSN: [id number]     Arrival date & time 04/30/15  1005 History   First MD Initiated Contact with Patient 04/30/15 1013     Chief Complaint  Patient presents with  . Vaginal Discharge      HPI  She presents for evaluation of vaginal discharge and pelvic pain. History of genital herpes. History of STDs. History of cystitis. Burning with urination over the last 4-5 days some vaginal pain that feels like her herpes. Last menstrual period was one week ago.  Past Medical History  Diagnosis Date  . Eczema   . Trichomonas   . Miscarriage   . Seasonal allergies    Past Surgical History  Procedure Laterality Date  . No past surgeries     Family History  Problem Relation Age of Onset  . Hypertension Maternal Grandfather   . Diabetes Maternal Grandfather   . Asthma Father    Social History  Substance Use Topics  . Smoking status: Current Every Day Smoker -- 0.50 packs/day    Types: Cigarettes  . Smokeless tobacco: Never Used  . Alcohol Use: Yes     Comment: socially   OB History    Gravida Para Term Preterm AB TAB SAB Ectopic Multiple Living   0    0  0        Review of Systems  Constitutional: Negative for fever, chills, diaphoresis, appetite change and fatigue.  HENT: Negative for mouth sores, sore throat and trouble swallowing.   Eyes: Negative for visual disturbance.  Respiratory: Negative for cough, chest tightness, shortness of breath and wheezing.   Cardiovascular: Negative for chest pain.  Gastrointestinal: Negative for nausea, vomiting, abdominal pain, diarrhea and abdominal distention.  Endocrine: Negative for polydipsia, polyphagia and polyuria.  Genitourinary: Positive for dysuria, frequency, vaginal discharge, vaginal pain and pelvic pain. Negative for hematuria.  Musculoskeletal: Negative for gait problem.  Skin: Negative for color change, pallor and rash.  Neurological: Negative for dizziness, syncope, light-headedness and headaches.  Hematological: Does not  bruise/bleed easily.  Psychiatric/Behavioral: Negative for behavioral problems and confusion.      Allergies  Review of patient's allergies indicates no known allergies.  Home Medications   Prior to Admission medications   Medication Sig Start Date End Date Taking? Authorizing Provider  fluconazole (DIFLUCAN) 150 MG tablet After last dose of antibiotic 04/30/15   Rolland PorterMark Donatella Walski, MD  sulfamethoxazole-trimethoprim (BACTRIM DS,SEPTRA DS) 800-160 MG tablet Take 2 tablets by mouth 2 (two) times daily. 04/30/15   Rolland PorterMark Gurpreet Mikhail, MD  valACYclovir (VALTREX) 1000 MG tablet Take 1 tablet (1,000 mg total) by mouth 3 (three) times daily. 04/30/15   Rolland PorterMark Samyia Motter, MD   BP 150/87 mmHg  Pulse 96  Temp(Src) 98.2 F (36.8 C) (Oral)  Resp 18  Ht 6\' 1"  (1.854 m)  Wt 175 lb (79.379 kg)  BMI 23.09 kg/m2  SpO2 100%  LMP 04/23/2015 Physical Exam  Constitutional: She is oriented to person, place, and time. She appears well-developed and well-nourished. No distress.  HENT:  Head: Normocephalic.  Eyes: Conjunctivae are normal. Pupils are equal, round, and reactive to light. No scleral icterus.  Neck: Normal range of motion. Neck supple. No thyromegaly present.  Cardiovascular: Normal rate and regular rhythm.  Exam reveals no gallop and no friction rub.   No murmur heard. Pulmonary/Chest: Effort normal and breath sounds normal. No respiratory distress. She has no wheezes. She has no rales.  Abdominal: Soft. Bowel sounds are normal. She exhibits no distension. There is no tenderness. There  is no rebound.  Musculoskeletal: Normal range of motion.  Neurological: She is alert and oriented to person, place, and time.  Skin: Skin is warm and dry. No rash noted.  Psychiatric: She has a normal mood and affect. Her behavior is normal.   thick white vaginal discharge. No cervical motion tenderness. No lesions or vesicles.  ED Course  Procedures (including critical care time) Labs Review Labs Reviewed  WET PREP,  GENITAL - Abnormal; Notable for the following:    Clue Cells Wet Prep HPF POC PRESENT (*)    WBC, Wet Prep HPF POC FEW (*)    All other components within normal limits  URINALYSIS, ROUTINE W REFLEX MICROSCOPIC (NOT AT [facility]) - Abnormal; Notable for the following:    APPearance CLOUDY (*)    Leukocytes, UA MODERATE (*)    All other components within normal limits  URINE MICROSCOPIC-ADD ON - Abnormal; Notable for the following:    Squamous Epithelial / LPF 6-30 (*)    Bacteria, UA FEW (*)    All other components within normal limits  URINE CULTURE  PREGNANCY, URINE  GC/CHLAMYDIA PROBE AMP ([place]) NOT AT [facility]    Imaging Review No results found. I have personally reviewed and evaluated these images and lab results as part of my medical decision-making.   EKG Interpretation None      MDM   Final diagnoses:  UTI (lower urinary tract infection)  Yeast vaginitis        Rolland Porter, MD 04/30/15 1205

## 2015-04-30 NOTE — ED Notes (Signed)
Pt reports vaginal discharge, white in color, malodorous. Pt also reports being told she had herpes last year. Reports outbreak at this time. Pt did no follow up with GYN last year.

## 2015-05-01 LAB — GC/CHLAMYDIA PROBE AMP (~~LOC~~) NOT AT ARMC
CHLAMYDIA, DNA PROBE: NEGATIVE
NEISSERIA GONORRHEA: NEGATIVE

## 2015-05-01 LAB — URINE CULTURE

## 2015-06-04 ENCOUNTER — Emergency Department (HOSPITAL_BASED_OUTPATIENT_CLINIC_OR_DEPARTMENT_OTHER)
Admission: EM | Admit: 2015-06-04 | Discharge: 2015-06-04 | Discharge status: Against Medical Advice | Payer: No Typology Code available for payment source

## 2015-09-07 ENCOUNTER — Encounter (HOSPITAL_BASED_OUTPATIENT_CLINIC_OR_DEPARTMENT_OTHER): Payer: Self-pay | Admitting: Emergency Medicine

## 2015-09-07 ENCOUNTER — Emergency Department (HOSPITAL_BASED_OUTPATIENT_CLINIC_OR_DEPARTMENT_OTHER)
Admission: EM | Admit: 2015-09-07 | Discharge: 2015-09-07 | Disposition: A | Discharge status: Home / Self Care | Payer: No Typology Code available for payment source | Attending: Emergency Medicine | Admitting: Emergency Medicine

## 2015-09-07 DIAGNOSIS — A64 Unspecified sexually transmitted disease: Secondary | ICD-10-CM | POA: Insufficient documentation

## 2015-09-07 DIAGNOSIS — Z872 Personal history of diseases of the skin and subcutaneous tissue: Secondary | ICD-10-CM | POA: Insufficient documentation

## 2015-09-07 DIAGNOSIS — Z79899 Other long term (current) drug therapy: Secondary | ICD-10-CM | POA: Insufficient documentation

## 2015-09-07 DIAGNOSIS — F17210 Nicotine dependence, cigarettes, uncomplicated: Secondary | ICD-10-CM | POA: Insufficient documentation

## 2015-09-07 DIAGNOSIS — Z792 Long term (current) use of antibiotics: Secondary | ICD-10-CM | POA: Insufficient documentation

## 2015-09-07 LAB — WET PREP, GENITAL
SPERM: NONE SEEN
TRICH WET PREP: NONE SEEN

## 2015-09-07 MED ORDER — FLUCONAZOLE 100 MG PO TABS
200.0000 mg | ORAL_TABLET | Freq: Once | ORAL | Status: AC
  Administered 2015-09-07: 200 mg via ORAL
  Filled 2015-09-07: qty 2

## 2015-09-07 MED ORDER — LIDOCAINE HCL (PF) 1 % IJ SOLN
INTRAMUSCULAR | Status: AC
  Filled 2015-09-07: qty 5

## 2015-09-07 MED ORDER — AZITHROMYCIN 250 MG PO TABS
1000.0000 mg | ORAL_TABLET | Freq: Once | ORAL | Status: AC
  Administered 2015-09-07: 1000 mg via ORAL
  Filled 2015-09-07: qty 4

## 2015-09-07 MED ORDER — CEFTRIAXONE SODIUM 250 MG IJ SOLR
250.0000 mg | Freq: Once | INTRAMUSCULAR | Status: AC
  Administered 2015-09-07: 250 mg via INTRAMUSCULAR
  Filled 2015-09-07: qty 250

## 2015-09-07 NOTE — ED Provider Notes (Addendum)
CSN: [id number]     Arrival date & time 09/07/15  0930 History   First MD Initiated Contact with Patient 09/07/15 [phone]     Chief Complaint  Patient presents with  . Exposure to STD     (Consider location/radiation/quality/duration/timing/severity/associated sxs/prior Treatment) Patient is a 25 y.o. female presenting with STD exposure. The history is provided by the patient.  Exposure to STD This is a new problem. Episode onset: within the last month. The problem occurs constantly. The problem has been gradually worsening. Pertinent negatives include no abdominal pain. Associated symptoms comments: No fever or pelvic pain.  Discharges not odorous, thickened itchy. It is been going on for the last 1 month. She has tried multiple over-the-counter yeast medications without improvement in symptoms. Than her partner was tested yesterday and was diagnosed with chlamydia.. Nothing aggravates the symptoms. Nothing relieves the symptoms. She has tried nothing for the symptoms. The treatment provided no relief.    Past Medical History  Diagnosis Date  . Eczema   . Trichomonas   . Miscarriage   . Seasonal allergies    Past Surgical History  Procedure Laterality Date  . No past surgeries     Family History  Problem Relation Age of Onset  . Hypertension Maternal Grandfather   . Diabetes Maternal Grandfather   . Asthma Father    Social History  Substance Use Topics  . Smoking status: Current Every Day Smoker -- 0.50 packs/day    Types: Cigarettes  . Smokeless tobacco: Never Used  . Alcohol Use: Yes     Comment: socially   OB History    Gravida Para Term Preterm AB TAB SAB Ectopic Multiple Living   0    0  0        Review of Systems  Gastrointestinal: Negative for abdominal pain.  All other systems reviewed and are negative.     Allergies  Review of patient's allergies indicates no known allergies.  Home Medications   Prior to Admission medications   Medication Sig Start Date  End Date Taking? Authorizing Provider  fluconazole (DIFLUCAN) 150 MG tablet After last dose of antibiotic 04/30/15   Rolland Porter, MD  sulfamethoxazole-trimethoprim (BACTRIM DS,SEPTRA DS) 800-160 MG tablet Take 2 tablets by mouth 2 (two) times daily. 04/30/15   Rolland Porter, MD  valACYclovir (VALTREX) 1000 MG tablet Take 1 tablet (1,000 mg total) by mouth 3 (three) times daily. 04/30/15   Rolland Porter, MD   BP 147/90 mmHg  Pulse 78  Temp(Src) 98.7 F (37.1 C) (Oral)  Resp 18  Ht  (1.676 m)  Wt 173 lb 2 oz (78.529 kg)  BMI 27.96 kg/m2  SpO2 100%  LMP 08/31/2015 Physical Exam  Constitutional: She is oriented to person, place, and time. She appears well-developed and well-nourished. No distress.  HENT:  Head: Normocephalic and atraumatic.  Mouth/Throat: Oropharynx is clear and moist.  Eyes: Conjunctivae and EOM are normal. Pupils are equal, round, and reactive to light.  Neck: Normal range of motion. Neck supple.  Cardiovascular: Normal rate, regular rhythm and intact distal pulses.   No murmur heard. Pulmonary/Chest: Effort normal and breath sounds normal. No respiratory distress. She has no wheezes. She has no rales.  Abdominal: Soft. She exhibits no distension. There is no tenderness. There is no rebound and no guarding.  Genitourinary: Uterus normal. Cervix exhibits discharge. Cervix exhibits no motion tenderness and no friability. Right adnexum displays no mass, no tenderness and no fullness. Left adnexum displays no mass, no  tenderness and no fullness. Vaginal discharge found.  Musculoskeletal: Normal range of motion. She exhibits no edema or tenderness.  Neurological: She is alert and oriented to person, place, and time.  Skin: Skin is warm and dry. No rash noted. No erythema.  Psychiatric: She has a normal mood and affect. Her behavior is normal.  Nursing note and vitals reviewed.   ED Course  Procedures (including critical care time) Labs Review Labs Reviewed  WET PREP,  GENITAL - Abnormal; Notable for the following:    Yeast Wet Prep HPF POC PRESENT (*)    Clue Cells Wet Prep HPF POC PRESENT (*)    WBC, Wet Prep HPF POC MANY (*)    All other components within normal limits  HIV ANTIBODY (ROUTINE TESTING)  RPR  GC/CHLAMYDIA PROBE AMP ([place]) NOT AT [facility]    Imaging Review No results found. I have personally reviewed and evaluated these images and lab results as part of my medical decision-making.   EKG Interpretation None      MDM   Final diagnoses:  STD (female)    Patient is a 25 year old female presenting today with one month of vaginal discharge with a partner who was recently treated yesterday for chlamydia. Patient states that she is sexually active with only 1 partner for the last year and does not use protection. She thought she had a yeast infection but everything she had tried over-the-counter was not working. She denies any abdominal pain, fever or urinary complaints at this time. LMP was one week ago. On exam patient has vaginal discharge without any symptoms concerning for PID. GC chlamydia, HIV and RPR sent. Patient treated with Rocephin and azithromycin.  11:12 AM Wet prep also positive for yeast and will give diflucan   Gwyneth SproutWhitney Khing Belcher, MD 09/07/15 1037  Gwyneth SproutWhitney Taneesha Edgin, MD 09/07/15 1038  Gwyneth SproutWhitney Adelei Scobey, MD 09/07/15 1112

## 2015-09-07 NOTE — Discharge Instructions (Signed)
Sexually Transmitted Disease °A sexually transmitted disease (STD) is a disease or infection that may be passed (transmitted) from person to person, usually during sexual activity. This may happen by way of saliva, semen, blood, vaginal mucus, or urine. Common STDs include: °· Gonorrhea. °· Chlamydia. °· Syphilis. °· HIV and AIDS. °· Genital herpes. °· Hepatitis B and C. °· Trichomonas. °· Human papillomavirus (HPV). °· Pubic lice. °· Scabies. °· Mites. °· Bacterial vaginosis. °WHAT ARE CAUSES OF STDs? °An STD may be caused by bacteria, a virus, or parasites. STDs are often transmitted during sexual activity if one person is infected. However, they may also be transmitted through nonsexual means. STDs may be transmitted after:  °· Sexual intercourse with an infected person. °· Sharing sex toys with an infected person. °· Sharing needles with an infected person or using unclean piercing or tattoo needles. °· Having intimate contact with the genitals, mouth, or rectal areas of an infected person. °· Exposure to infected fluids during birth. °WHAT ARE THE SIGNS AND SYMPTOMS OF STDs? °Different STDs have different symptoms. Some people may not have any symptoms. If symptoms are present, they may include: °· Painful or bloody urination. °· Pain in the pelvis, abdomen, vagina, anus, throat, or eyes. °· A skin rash, itching, or irritation. °· Growths, ulcerations, blisters, or sores in the genital and anal areas. °· Abnormal vaginal discharge with or without bad odor. °· Penile discharge in men. °· Fever. °· Pain or bleeding during sexual intercourse. °· Swollen glands in the groin area. °· Yellow skin and eyes (jaundice). This is seen with hepatitis. °· Swollen testicles. °· Infertility. °· Sores and blisters in the mouth. °HOW ARE STDs DIAGNOSED? °To make a diagnosis, your health care provider may: °· Take a medical history. °· Perform a physical exam. °· Take a sample of any discharge to examine. °· Swab the throat,  cervix, opening to the penis, rectum, or vagina for testing. °· Test a sample of your first morning urine. °· Perform blood tests. °· Perform a Pap test, if this applies. °· Perform a colposcopy. °· Perform a laparoscopy. °HOW ARE STDs TREATED? °Treatment depends on the STD. Some STDs may be treated but not cured. °· Chlamydia, gonorrhea, trichomonas, and syphilis can be cured with antibiotic medicine. °· Genital herpes, hepatitis, and HIV can be treated, but not cured, with prescribed medicines. The medicines lessen symptoms. °· Genital warts from HPV can be treated with medicine or by freezing, burning (electrocautery), or surgery. Warts may come back. °· HPV cannot be cured with medicine or surgery. However, abnormal areas may be removed from the cervix, vagina, or vulva. °· If your diagnosis is confirmed, your recent sexual partners need treatment. This is true even if they are symptom-free or have a negative culture or evaluation. They should not have sex until their health care providers say it is okay. °· Your health care provider may test you for infection again 3 months after treatment. °HOW CAN I REDUCE MY RISK OF GETTING AN STD? °Take these steps to reduce your risk of getting an STD: °· Use latex condoms, dental dams, and water-soluble lubricants during sexual activity. Do not use petroleum jelly or oils. °· Avoid having multiple sex partners. °· Do not have sex with someone who has other sex partners °· Do not have sex with anyone you do not know or who is at high risk for an STD. °· Avoid risky sex practices that can break your skin. °· Do not have sex   if you have open sores on your mouth or skin. °· Avoid drinking too much alcohol or taking illegal drugs. Alcohol and drugs can affect your judgment and put you in a vulnerable position. °· Avoid engaging in oral and anal sex acts. °· Get vaccinated for HPV and hepatitis. If you have not received these vaccines in the past, talk to your health care  provider about whether one or both might be right for you. °· If you are at risk of being infected with HIV, it is recommended that you take a prescription medicine daily to prevent HIV infection. This is called pre-exposure prophylaxis (PrEP). You are considered at risk if: °¨ You are a man who has sex with other men (MSM). °¨ You are a heterosexual man or woman and are sexually active with more than one partner. °¨ You take drugs by injection. °¨ You are sexually active with a partner who has HIV. °· Talk with your health care provider about whether you are at high risk of being infected with HIV. If you choose to begin PrEP, you should first be tested for HIV. You should then be tested every 3 months for as long as you are taking PrEP. °WHAT SHOULD I DO IF I THINK I HAVE AN STD? °· See your health care provider. °· Tell your sexual partner(s). They should be tested and treated for any STDs. °· Do not have sex until your health care provider says it is okay. °WHEN SHOULD I GET IMMEDIATE MEDICAL CARE? °Contact your health care provider right away if:  °· You have severe abdominal pain. °· You are a man and notice swelling or pain in your testicles. °· You are a woman and notice swelling or pain in your vagina. °  °This information is not intended to replace advice given to you by your health care provider. Make sure you discuss any questions you have with your health care provider. °  °Document Released: 08/13/2002 Document Revised: 06/13/2014 Document Reviewed: 12/11/2012 °Elsevier Interactive Patient Education ©2016 [employer] ° °

## 2015-09-07 NOTE — ED Notes (Signed)
Pt exposed to std, partner tested positive for chlamydia last week. Pt reports vaginal discharge x 1 month. Last sexual intercourse on saturday

## 2015-09-07 NOTE — ED Notes (Signed)
Pt made aware to return if symptoms worsen or if any life threatening symptoms occur.   

## 2015-09-08 LAB — GC/CHLAMYDIA PROBE AMP (~~LOC~~) NOT AT ARMC
CHLAMYDIA, DNA PROBE: NEGATIVE
NEISSERIA GONORRHEA: NEGATIVE

## 2015-09-08 LAB — RPR: RPR Ser Ql: NONREACTIVE

## 2015-09-08 LAB — HIV ANTIBODY (ROUTINE TESTING W REFLEX): HIV SCREEN 4TH GENERATION: NONREACTIVE

## 2016-03-16 ENCOUNTER — Emergency Department (HOSPITAL_BASED_OUTPATIENT_CLINIC_OR_DEPARTMENT_OTHER)
Admission: EM | Admit: 2016-03-16 | Discharge: 2016-03-16 | Disposition: A | Discharge status: Home / Self Care | Payer: Self-pay | Attending: Physician Assistant | Admitting: Physician Assistant

## 2016-03-16 ENCOUNTER — Encounter (HOSPITAL_BASED_OUTPATIENT_CLINIC_OR_DEPARTMENT_OTHER): Payer: Self-pay | Admitting: *Deleted

## 2016-03-16 DIAGNOSIS — N898 Other specified noninflammatory disorders of vagina: Secondary | ICD-10-CM | POA: Insufficient documentation

## 2016-03-16 DIAGNOSIS — B9689 Other specified bacterial agents as the cause of diseases classified elsewhere: Secondary | ICD-10-CM

## 2016-03-16 DIAGNOSIS — F17210 Nicotine dependence, cigarettes, uncomplicated: Secondary | ICD-10-CM | POA: Insufficient documentation

## 2016-03-16 DIAGNOSIS — N760 Acute vaginitis: Secondary | ICD-10-CM | POA: Insufficient documentation

## 2016-03-16 DIAGNOSIS — Z202 Contact with and (suspected) exposure to infections with a predominantly sexual mode of transmission: Secondary | ICD-10-CM | POA: Insufficient documentation

## 2016-03-16 LAB — URINALYSIS, ROUTINE W REFLEX MICROSCOPIC
Bilirubin Urine: NEGATIVE
GLUCOSE, UA: NEGATIVE mg/dL
Hgb urine dipstick: NEGATIVE
KETONES UR: NEGATIVE mg/dL
NITRITE: NEGATIVE
PROTEIN: NEGATIVE mg/dL
Specific Gravity, Urine: 1.028 (ref 1.005–1.030)
pH: 6.5 (ref 5.0–8.0)

## 2016-03-16 LAB — URINE MICROSCOPIC-ADD ON: RBC / HPF: NONE SEEN RBC/hpf (ref 0–5)

## 2016-03-16 LAB — WET PREP, GENITAL
SPERM: NONE SEEN
TRICH WET PREP: NONE SEEN
YEAST WET PREP: NONE SEEN

## 2016-03-16 LAB — HCG, QUANTITATIVE, PREGNANCY: hCG, Beta Chain, Quant, S: <1 m[IU]/mL (ref <5)

## 2016-03-16 MED ORDER — AZITHROMYCIN 250 MG PO TABS
1000.0000 mg | ORAL_TABLET | Freq: Once | ORAL | Status: AC
  Administered 2016-03-16: 1000 mg via ORAL
  Filled 2016-03-16: qty 4

## 2016-03-16 MED ORDER — CEFTRIAXONE SODIUM 250 MG IJ SOLR
250.0000 mg | Freq: Once | INTRAMUSCULAR | Status: AC
  Administered 2016-03-16: 250 mg via INTRAMUSCULAR
  Filled 2016-03-16: qty 250

## 2016-03-16 MED ORDER — METRONIDAZOLE 500 MG PO TABS: 500.0000 mg | ORAL_TABLET | Freq: Two times a day (BID) | ORAL | 0 refills | Status: DC

## 2016-03-16 NOTE — Discharge Instructions (Addendum)
You have been diagnosed with bacterial vaginosis.  Take flagyl as prescribed for the full duration.  Avoid alcohol use while taking antibiotic.  You will be notify if you are tested positive for other sexually transmitted infection in the next 2-4 days.

## 2016-03-16 NOTE — ED Triage Notes (Signed)
Pt states has been exposed to chlamydia  And needs tx.

## 2016-03-16 NOTE — ED Provider Notes (Signed)
[facility] Provider Note   CSN: [id number] Arrival date & time: 03/16/16  1737  By signing my name below, I, Phillis HaggisGabriella Gaje, attest that this documentation has been prepared under the direction and in the presence of Fayrene HelperBowie Dilcia Rybarczyk, PA-C. Electronically Signed: Phillis HaggisGabriella Gaje, ED Scribe. 03/16/16. 7:02 PM.  History   Chief Complaint Chief Complaint  Patient presents with  . Exposure to STD   The history is provided by the patient. No language interpreter was used.   HPI Comments: Jennifer Roberson is a 25 y.o. female with a hx of trichomonas who presents to the Emergency Department requesting an STD screening. She reports associated foul odor from the vagina, intermittent abdominal pain, and thick vaginal discharge. Pt says that her boyfriend tested positive for chlamydia and pt was notified of this on 03/12/16. Boyfriend was treated on 03/12/16. Pt has only been sexually active with this current partner over the past 6 months. She says that he has exposed her to chlamydia about 5 times in the past. She does not use protection. LMP 02/24/16. Pt reports one non-viable pregnancy in 2015. She denies fever, chills, nausea, vomiting, diarrhea, dysuria, or hematuria.   Past Medical History:  Diagnosis Date  . Eczema   . Miscarriage   . Seasonal allergies   . Trichomonas     There are no active problems to display for this patient.   Past Surgical History:  Procedure Laterality Date  . NO PAST SURGERIES      OB History    Gravida Para Term Preterm AB Living   0       0     SAB TAB Ectopic Multiple Live Births   0               Home Medications    Prior to Admission medications   Not on File    Family History Family History  Problem Relation Age of Onset  . Hypertension Maternal Grandfather   . Diabetes Maternal Grandfather   . Asthma Father     Social History Social History  Substance Use Topics  . Smoking status: Current Every Day Smoker   Packs/day: 0.50    Types: Cigarettes  . Smokeless tobacco: Never Used  . Alcohol use Yes     Comment: socially     Allergies   Review of patient's allergies indicates no known allergies.   Review of Systems Review of Systems  Gastrointestinal: Positive for abdominal pain. Negative for diarrhea, nausea and vomiting.  Genitourinary: Positive for vaginal discharge. Negative for dysuria and hematuria.     Physical Exam Updated Vital Signs BP 127/82 (BP Location: Left Arm)   Pulse 90   Temp 98 F (36.7 C) (Oral)   Resp 16   Ht 5\' 11"  (1.803 m)   Wt 175 lb (79.4 kg)   SpO2 98%   BMI 24.41 kg/m   Physical Exam  Constitutional: She is oriented to person, place, and time. She appears well-developed and well-nourished. No distress.  HENT:  Head: Normocephalic and atraumatic.  Mouth/Throat: Oropharynx is clear and moist. No oropharyngeal exudate.  Eyes: Conjunctivae and EOM are normal. Pupils are equal, round, and reactive to light.  Neck: Normal range of motion. Neck supple.  Genitourinary:  Genitourinary Comments: Pelvic exam: RN in room as chaperone, external female genitalia normal with no signs of lesions or injuries. Speculum exam shows normal cervix with no obvious discharge. Bimanual exam with no adnexal tenderness, no cervical motion tenderness, uterus normal  size and nontender, no masses appreciated. The external cervical os is closed.   Musculoskeletal: Normal range of motion.  Neurological: She is alert and oriented to person, place, and time.  Skin: Skin is warm and dry.  Psychiatric: She has a normal mood and affect. Her behavior is normal.   ED Treatments / Results  DIAGNOSTIC STUDIES: Oxygen Saturation is 98% on RA, normal by my interpretation.    COORDINATION OF CARE: 7:01 PM-Discussed treatment plan which includes pelvic exam with pt at bedside and pt agreed to plan.    Labs (all labs ordered are listed, but only abnormal results are displayed) Labs  Reviewed - No data to display  EKG  EKG Interpretation None       Radiology No results found.  Procedures Procedures (including critical care time)  Medications Ordered in ED Medications - No data to display   Initial Impression / Assessment and Plan / ED Course  I have reviewed the triage vital signs and the nursing notes.  Pertinent labs & imaging results that were available during my care of the patient were reviewed by me and considered in my medical decision making (see chart for details).  Clinical Course    BP 127/82 (BP Location: Left Arm)   Pulse 90   Temp 98 F (36.7 C) (Oral)   Resp 16   Ht 5\' 11"  (1.803 m)   Wt 79.4 kg   SpO2 98%   BMI 24.41 kg/m   Patient treated in the ED for STI with rocephin/zithromax. Evidence of BV, flagyl given. Pt advised on safe sex practices and understands that they have GC/Chlamydia cultures pending and will result in 2-3 days. HIV and RPR sent. Pt encouraged to follow up at local health department for future STI checks. No concern for PID. Discussed return precautions. Pt appears safe for discharge.    Final Clinical Impressions(s) / ED Diagnoses   Final diagnoses:  Exposure to STD  BV (bacterial vaginosis)   I personally performed the services described in this documentation, which was scribed in my presence. The recorded information has been reviewed and is accurate.     New Prescriptions New Prescriptions   METRONIDAZOLE (FLAGYL) 500 MG TABLET    Take 1 tablet (500 mg total) by mouth 2 (two) times daily. One po bid x 7 days     Fayrene Helper, PA-C 03/16/16 2053    Courteney Lyn Mackuen, MD 03/16/16 2247

## 2016-03-16 NOTE — ED Notes (Signed)
Pt left w/o receiving discharge instructions. Called pt and she will come back 03/17/16 to get rx and instructions.

## 2016-03-17 LAB — RPR: RPR: NONREACTIVE

## 2016-03-17 LAB — GC/CHLAMYDIA PROBE AMP (~~LOC~~) NOT AT ARMC
CHLAMYDIA, DNA PROBE: NEGATIVE
NEISSERIA GONORRHEA: NEGATIVE

## 2016-03-17 LAB — HIV ANTIBODY (ROUTINE TESTING W REFLEX): HIV SCREEN 4TH GENERATION: NONREACTIVE

## 2016-06-14 ENCOUNTER — Encounter (HOSPITAL_BASED_OUTPATIENT_CLINIC_OR_DEPARTMENT_OTHER): Payer: Self-pay | Admitting: Emergency Medicine

## 2016-06-14 ENCOUNTER — Emergency Department (HOSPITAL_BASED_OUTPATIENT_CLINIC_OR_DEPARTMENT_OTHER)
Admission: EM | Admit: 2016-06-14 | Discharge: 2016-06-14 | Disposition: A | Discharge status: Home / Self Care | Payer: Medicaid Other | Attending: Emergency Medicine | Admitting: Emergency Medicine

## 2016-06-14 DIAGNOSIS — Z3A01 Less than 8 weeks gestation of pregnancy: Secondary | ICD-10-CM | POA: Diagnosis not present

## 2016-06-14 DIAGNOSIS — O219 Vomiting of pregnancy, unspecified: Secondary | ICD-10-CM | POA: Diagnosis not present

## 2016-06-14 DIAGNOSIS — Z3490 Encounter for supervision of normal pregnancy, unspecified, unspecified trimester: Secondary | ICD-10-CM

## 2016-06-14 DIAGNOSIS — O99331 Smoking (tobacco) complicating pregnancy, first trimester: Secondary | ICD-10-CM | POA: Insufficient documentation

## 2016-06-14 DIAGNOSIS — F17210 Nicotine dependence, cigarettes, uncomplicated: Secondary | ICD-10-CM | POA: Diagnosis not present

## 2016-06-14 DIAGNOSIS — R1110 Vomiting, unspecified: Secondary | ICD-10-CM

## 2016-06-14 LAB — PREGNANCY, URINE: PREG TEST UR: POSITIVE — AB

## 2016-06-14 LAB — URINALYSIS, ROUTINE W REFLEX MICROSCOPIC
BILIRUBIN URINE: NEGATIVE
GLUCOSE, UA: NEGATIVE mg/dL
HGB URINE DIPSTICK: NEGATIVE
Ketones, ur: NEGATIVE mg/dL
Leukocytes, UA: NEGATIVE
Nitrite: NEGATIVE
Protein, ur: NEGATIVE mg/dL
Specific Gravity, Urine: 1.016 (ref 1.005–1.030)
pH: 7 (ref 5.0–8.0)

## 2016-06-14 MED ORDER — PRENATAL VITAMINS 28-0.8 MG PO TABS: 1.0000 | ORAL_TABLET | ORAL | 0 refills | Status: DC

## 2016-06-14 MED ORDER — RANITIDINE HCL 150 MG PO TABS: 150.0000 mg | ORAL_TABLET | Freq: Every day | ORAL | 1 refills | Status: DC

## 2016-06-14 NOTE — ED Triage Notes (Signed)
Pt reports episodes of vomiting, dizziness and HA about twice a week since the beginning of December.  Sts it has happened every night this week.  Has no pmd.

## 2016-06-14 NOTE — ED Provider Notes (Signed)
[facility] Provider Note   CSN: [id number] Arrival date & time: 06/14/16  1723   By signing my name below, I, Jennifer Roberson, attest that this documentation has been prepared under the direction and in the presence of [name], PA-C. Electronically Signed: Teofilo Roberson, ED Scribe. 06/14/2016. 5:59 PM.   History   Chief Complaint Chief Complaint  Patient presents with  . Emesis    The history is provided by the patient. No language interpreter was used.   HPI Comments:  Jennifer Roberson is a 26 y.o. female who presents to the Emergency Department complaining of intermittent episodes of vomiting over the past month. Pt states that over the past 2 days she vomited multiple times each day, and notes 3 episodes last night with 0 episodes today. She states that she usually vomits at night after getting in to bed. She reports feelings of burping, belching and sour taste in her mouth at times. Patient reports she sometimes has midabdominal pain before vomiting. She denies any current abdominal pain. She's had no nausea or vomiting today. She denies any vaginal bleeding or vaginal discharge.  LNMP was 05/14/16. Pt took a pregnancy test at home that was negative. She is sexually active, and states that she uses the pull out method but not protection. Pt denies any history of abdominal surgeries. No alleviating factors noted.  Pt denies urinary symptoms, hematuria, vaginal bleeding, SOB.   Past Medical History:  Diagnosis Date  . Eczema   . Miscarriage   . Seasonal allergies   . Trichomonas     There are no active problems to display for this patient.   Past Surgical History:  Procedure Laterality Date  . NO PAST SURGERIES      OB History    Gravida Para Term Preterm AB Living   0       0     SAB TAB Ectopic Multiple Live Births   0               Home Medications    Prior to Admission medications   Medication Sig Start Date End Date Taking?  Authorizing Provider  ferrous sulfate 325 (65 FE) MG EC tablet Take 325 mg by mouth 3 (three) times daily with meals.   Yes Historical Provider, MD  metroNIDAZOLE (FLAGYL) 500 MG tablet Take 1 tablet (500 mg total) by mouth 2 (two) times daily. One po bid x 7 days 03/16/16   Fayrene Helper, PA-C  Prenatal Vit-Fe Fumarate-FA (PRENATAL VITAMINS) 28-0.8 MG TABS Take 1 tablet by mouth as directed. 06/14/16   [name], PA-C  ranitidine (ZANTAC) 150 MG tablet Take 1 tablet (150 mg total) by mouth at bedtime. 06/14/16   [name], PA-C    Family History Family History  Problem Relation Age of Onset  . Hypertension Maternal Grandfather   . Diabetes Maternal Grandfather   . Asthma Father     Social History Social History  Substance Use Topics  . Smoking status: Current Every Day Smoker    Packs/day: 0.50    Types: Cigarettes  . Smokeless tobacco: Never Used  . Alcohol use Yes     Comment: socially     Allergies   Patient has no known allergies.   Review of Systems Review of Systems  Constitutional: Negative for chills and fever.  Eyes: Negative for visual disturbance.  Respiratory: Negative for shortness of breath.   Gastrointestinal: Positive for nausea and vomiting. Negative for abdominal pain  and blood in stool.  Genitourinary: Negative for difficulty urinating, dysuria, frequency, hematuria, urgency, vaginal bleeding and vaginal discharge.  Skin: Negative for rash and wound.  Neurological: Negative for dizziness, syncope, weakness and light-headedness.     Physical Exam Updated Vital Signs BP 141/86 (BP Location: Left Arm)   Pulse 87   Temp 98.3 F (36.8 C) (Oral)   Resp 18   Ht 5\' 11"  (1.803 m)   Wt 73 kg   LMP 05/14/2016 (Exact Date)   SpO2 100%   BMI 22.45 kg/m   Physical Exam  Constitutional: She appears well-developed and well-nourished. No distress.  Nontoxic appearing.  HENT:  Head: Normocephalic and atraumatic.  Mouth/Throat: Oropharynx is clear and  moist.  Eyes: Conjunctivae are normal. Pupils are equal, round, and reactive to light. Right eye exhibits no discharge. Left eye exhibits no discharge.  Neck: Neck supple.  Cardiovascular: Normal rate, regular rhythm, normal heart sounds and intact distal pulses.  Exam reveals no gallop and no friction rub.   No murmur heard. Pulmonary/Chest: Effort normal and breath sounds normal. No respiratory distress. She has no wheezes. She has no rales.  Abdominal: Soft. Bowel sounds are normal. She exhibits no mass. There is no tenderness. There is no guarding.  Abdomen soft and nontender palpation. No CVA or flank tenderness. No peritoneal signs.  Musculoskeletal: She exhibits no edema.  Lymphadenopathy:    She has no cervical adenopathy.  Neurological: She is alert. Coordination normal.  Skin: Skin is warm and dry. Capillary refill takes less than 2 seconds. No rash noted. She is not diaphoretic. No erythema. No pallor.  Psychiatric: She has a normal mood and affect. Her behavior is normal.  Nursing note and vitals reviewed.    ED Treatments / Results  DIAGNOSTIC STUDIES:  Oxygen Saturation is 100% on RA, normal by my interpretation.    COORDINATION OF CARE:  5:59 PM Discussed treatment plan with pt at bedside and pt agreed to plan.   Labs (all labs ordered are listed, but only abnormal results are displayed) Labs Reviewed  PREGNANCY, URINE - Abnormal; Notable for the following:       Result Value   Preg Test, Ur POSITIVE (*)    All other components within normal limits  URINALYSIS, ROUTINE W REFLEX MICROSCOPIC    EKG  EKG Interpretation None       Radiology No results found.  Procedures Procedures (including critical care time)  Medications Ordered in ED Medications - No data to display   Initial Impression / Assessment and Plan / ED Course  I have reviewed the triage vital signs and the nursing notes.  Pertinent labs & imaging results that were available during my  care of the patient were reviewed by me and considered in my medical decision making (see chart for details).  Clinical Course    This  is a 26 y.o. female who presents to the Emergency Department complaining of intermittent episodes of vomiting over the past month. Pt states that over the past 2 days she vomited multiple times each day, and notes 3 episodes last night with 0 episodes today. She states that she usually vomits at night after getting in to bed. She reports feelings of burping, belching and sour taste in her mouth at times. Patient reports she sometimes has midabdominal pain before vomiting. She denies any current abdominal pain. She's had no nausea or vomiting today. She denies any vaginal bleeding or vaginal discharge.  LNMP was 05/14/16. Pt took a  pregnancy test at home that was negative. She is sexually active, and states that she uses the pull out method but not protection. On exam patient is afebrile nontoxic appearing. Abdomen is soft and nontender to palpation. No peritoneal signs. Pregnancy test is positive. Urinalysis is without sign of infection. Patient likely with early pregnancy. Last cycle was 4 weeks ago. No signs or symptoms to suggest an ectopic pregnancy. We'll have her follow-up with the women's outpatient clinic next week. Discussed strict specific return precautions. Will start on prenatal vitamins. We'll also provide her with Zantac for likely some symptoms of acid reflux. I advised the patient to follow-up with their primary care provider this week. I advised the patient to return to the emergency department with new or worsening symptoms or new concerns. The patient verbalized understanding and agreement with plan.    This patient was discussed with Dr. Adela Lank who agrees with assessment and plan.   Final Clinical Impressions(s) / ED Diagnoses   Final diagnoses:  Pregnancy, unspecified gestational age  Intermittent vomiting    New Prescriptions New  Prescriptions   PRENATAL VIT-FE FUMARATE-FA (PRENATAL VITAMINS) 28-0.8 MG TABS    Take 1 tablet by mouth as directed.   RANITIDINE (ZANTAC) 150 MG TABLET    Take 1 tablet (150 mg total) by mouth at bedtime.    I personally performed the services described in this documentation, which was scribed in my presence. The recorded information has been reviewed and is accurate.      [name], PA-C 06/14/16 1819    Melene Plan, DO 06/14/16 2244

## 2016-06-15 ENCOUNTER — Encounter (HOSPITAL_COMMUNITY): Payer: Self-pay | Admitting: *Deleted

## 2016-06-15 ENCOUNTER — Inpatient Hospital Stay (HOSPITAL_COMMUNITY)
Admission: AD | Admit: 2016-06-15 | Discharge: 2016-06-15 | Disposition: A | Discharge status: Home / Self Care | Payer: Medicaid Other | Source: Ambulatory Visit | Attending: Obstetrics and Gynecology | Admitting: Obstetrics and Gynecology

## 2016-06-15 DIAGNOSIS — Z3A22 22 weeks gestation of pregnancy: Secondary | ICD-10-CM | POA: Diagnosis not present

## 2016-06-15 DIAGNOSIS — O219 Vomiting of pregnancy, unspecified: Secondary | ICD-10-CM | POA: Insufficient documentation

## 2016-06-15 NOTE — MAU Note (Signed)
Pt states she was [facility] for [facility].  Pt denies any issues today, no vomiting, bleeding or pain.  [facility] to "do whatever they didn't do."

## 2016-06-15 NOTE — MAU Provider Note (Signed)
[name] is a 26 y.o. G1P0000 at [id number] who presents to MAU today for pregnancy verification. She was seen yesterday at ED for nausea/vomiting and had positive pregnancy test. [facility] "[facility]ants [facility] have everything checked out". She denies any pain or bleeding today. She denies nausea today. She is eating while in MAU triage.  She desires to start prenatal care and needs pregnancy verification to apply for Medicaid.          BP 147/76 (BP Location: Right Arm)   Pulse 72   Temp 98.2 F (36.8 C) (Oral)   Resp 16   LMP 05/14/2016 (Exact Date)   CONSTITUTIONAL: Well-developed, well-nourished female in no acute distress.  CARDIOVASCULAR: Regular heart rate RESPIRATORY: Normal effort NEUROLOGICAL: Alert and oriented to person, place, and time.  SKIN: Skin is warm and dry. No rash noted. Not diaphoretic. No erythema. No pallor. PSYCH: Normal mood and affect. Normal behavior. Normal judgment and thought content.  MDM Medical Screen Exam Complete  A: 1. Nausea and vomiting during pregnancy prior to [redacted] weeks gestation     P: Discharge from MAU Pregnancy verification letter provided List of OB providers and safe OTC medications in pregnancy given Outpatient viability US ordered [facility] Patient may return to MAU as needed or if her condition were to change or worsen   Hurshel PartyLisa A Leftwich-Kirby, CNM  06/15/2016 12:09 PM

## 2016-06-15 NOTE — MAU Note (Signed)
Pt seen in triage by L. Leftwich-Kirby CNM for MSE & pregnancy verification letter.

## 2016-06-24 ENCOUNTER — Encounter (HOSPITAL_COMMUNITY): Payer: Self-pay | Admitting: Radiology

## 2016-06-24 ENCOUNTER — Inpatient Hospital Stay (HOSPITAL_COMMUNITY): Payer: Medicaid Other

## 2016-06-24 ENCOUNTER — Encounter: Payer: Self-pay | Admitting: Obstetrics and Gynecology

## 2016-06-24 ENCOUNTER — Inpatient Hospital Stay (HOSPITAL_COMMUNITY)
Admission: AD | Admit: 2016-06-24 | Discharge: 2016-06-24 | Disposition: A | Discharge status: Home / Self Care | Payer: Medicaid Other | Source: Ambulatory Visit | Attending: Obstetrics and Gynecology | Admitting: Obstetrics and Gynecology

## 2016-06-24 DIAGNOSIS — I10 Essential (primary) hypertension: Secondary | ICD-10-CM | POA: Insufficient documentation

## 2016-06-24 DIAGNOSIS — O209 Hemorrhage in early pregnancy, unspecified: Secondary | ICD-10-CM

## 2016-06-24 DIAGNOSIS — O26851 Spotting complicating pregnancy, first trimester: Secondary | ICD-10-CM | POA: Insufficient documentation

## 2016-06-24 DIAGNOSIS — Z8759 Personal history of other complications of pregnancy, childbirth and the puerperium: Secondary | ICD-10-CM | POA: Diagnosis not present

## 2016-06-24 DIAGNOSIS — Z3A01 Less than 8 weeks gestation of pregnancy: Secondary | ICD-10-CM | POA: Diagnosis not present

## 2016-06-24 DIAGNOSIS — Z87891 Personal history of nicotine dependence: Secondary | ICD-10-CM | POA: Insufficient documentation

## 2016-06-24 DIAGNOSIS — R102 Pelvic and perineal pain: Secondary | ICD-10-CM | POA: Insufficient documentation

## 2016-06-24 DIAGNOSIS — N83202 Unspecified ovarian cyst, left side: Secondary | ICD-10-CM | POA: Insufficient documentation

## 2016-06-24 DIAGNOSIS — N760 Acute vaginitis: Secondary | ICD-10-CM

## 2016-06-24 DIAGNOSIS — Z8619 Personal history of other infectious and parasitic diseases: Secondary | ICD-10-CM | POA: Diagnosis not present

## 2016-06-24 DIAGNOSIS — B9689 Other specified bacterial agents as the cause of diseases classified elsewhere: Secondary | ICD-10-CM

## 2016-06-24 DIAGNOSIS — O26899 Other specified pregnancy related conditions, unspecified trimester: Secondary | ICD-10-CM

## 2016-06-24 DIAGNOSIS — R109 Unspecified abdominal pain: Secondary | ICD-10-CM

## 2016-06-24 HISTORY — DX: Essential (primary) hypertension: I10

## 2016-06-24 LAB — CBC
HEMATOCRIT: 30.9 % — AB (ref 36.0–46.0)
Hemoglobin: 9.1 g/dL — ABNORMAL LOW (ref 12.0–15.0)
MCH: 18.1 pg — AB (ref 26.0–34.0)
MCHC: 29.4 g/dL — AB (ref 30.0–36.0)
MCV: 61.6 fL — AB (ref 78.0–100.0)
PLATELETS: 349 10*3/uL (ref 150–400)
RBC: 5.02 MIL/uL (ref 3.87–5.11)
RDW: 26.1 % — AB (ref 11.5–15.5)
WBC: 9.4 10*3/uL (ref 4.0–10.5)

## 2016-06-24 LAB — WET PREP, GENITAL
SPERM: NONE SEEN
Trich, Wet Prep: NONE SEEN
WBC, Wet Prep HPF POC: NONE SEEN
Yeast Wet Prep HPF POC: NONE SEEN

## 2016-06-24 LAB — URINALYSIS, ROUTINE W REFLEX MICROSCOPIC
BILIRUBIN URINE: NEGATIVE
GLUCOSE, UA: NEGATIVE mg/dL
HGB URINE DIPSTICK: NEGATIVE
Ketones, ur: NEGATIVE mg/dL
Leukocytes, UA: NEGATIVE
Nitrite: NEGATIVE
Protein, ur: NEGATIVE mg/dL
SPECIFIC GRAVITY, URINE: 1.025 (ref 1.005–1.030)
pH: 6 (ref 5.0–8.0)

## 2016-06-24 LAB — HCG, QUANTITATIVE, PREGNANCY: hCG, Beta Chain, Quant, S: 3371 m[IU]/mL — ABNORMAL HIGH (ref <5)

## 2016-06-24 MED ORDER — METRONIDAZOLE 500 MG PO TABS: 500.0000 mg | ORAL_TABLET | Freq: Two times a day (BID) | ORAL | 0 refills | Status: AC

## 2016-06-24 NOTE — MAU Note (Signed)
Has been having pain in lower abd.  Noted blood yesterday when she wiped. Only saw it the one time.  Hx of tubal pregnancy

## 2016-06-24 NOTE — MAU Provider Note (Signed)
Chief Complaint: Abdominal Pain and Vaginal Bleeding   First Provider Initiated Contact with Patient 06/24/16 1721        SUBJECTIVE HPI: Jennifer Roberson is a 26 y.o. G2P0010 at [redacted]w[redacted]d by LMP who presents to maternity admissions reporting lower abdominal pain with small amount of bleeding yesterday.  No further bleeding. Worried she has an ectopic pregnancy. She denies vaginal bleeding, vaginal itching/burning, urinary symptoms, h/a, dizziness, n/v, or fever/chills.   Abdominal Pain  This is a new problem. The current episode started yesterday. The onset quality is gradual. The problem occurs intermittently. The problem has been unchanged. The pain is located in the LLQ, RLQ and suprapubic region. The pain is mild. The quality of the pain is cramping. The abdominal pain does not radiate. Pertinent negatives include no constipation, diarrhea, fever, myalgias, nausea or vomiting. Nothing aggravates the pain. The pain is relieved by nothing. She has tried nothing for the symptoms.  Vaginal Bleeding  The patient's primary symptoms include missed menses, pelvic pain and vaginal bleeding. The patient's pertinent negatives include no genital itching, genital lesions or genital odor. This is a new problem. The current episode started yesterday. The problem occurs rarely. The problem has been resolved. The pain is mild. Associated symptoms include abdominal pain. Pertinent negatives include no constipation, diarrhea, fever, nausea or vomiting. The vaginal discharge was bloody. The vaginal bleeding is spotting. She has not been passing clots. She has not been passing tissue. Nothing aggravates the symptoms. She has tried nothing for the symptoms.    RN Note: [] Hover for attribution information Has been having pain in lower abd.  Noted blood yesterday when she wiped. Only saw it the one time.  Hx of tubal pregnancy  Past Medical History:  Diagnosis Date  . Eczema   . Miscarriage   . Seasonal allergies    . Trichomonas    Past Surgical History:  Procedure Laterality Date  . NO PAST SURGERIES     Social History   Social History  . Marital status: Single    Spouse name: N/A  . Number of children: N/A  . Years of education: N/A   Occupational History  . Not on file.   Social History Main Topics  . Smoking status: Former Smoker    Packs/day: 0.50    Types: Cigarettes    Quit date: 06/06/2016  . Smokeless tobacco: Never Used  . Alcohol use No     Comment: socially none since pregnancy  . Drug use: No  . Sexual activity: Yes    Birth control/ protection: None   Other Topics Concern  . Not on file   Social History Narrative  . No narrative on file   No current facility-administered medications on file prior to encounter.    Current Outpatient Prescriptions on File Prior to Encounter  Medication Sig Dispense Refill  . ferrous sulfate 325 (65 FE) MG EC tablet Take 325 mg by mouth 3 (three) times daily with meals.    . metroNIDAZOLE (FLAGYL) 500 MG tablet Take 1 tablet (500 mg total) by mouth 2 (two) times daily. One po bid x 7 days 14 tablet 0  . Prenatal Vit-Fe Fumarate-FA (PRENATAL VITAMINS) 28-0.8 MG TABS Take 1 tablet by mouth as directed. 30 tablet 0  . ranitidine (ZANTAC) 150 MG tablet Take 1 tablet (150 mg total) by mouth at bedtime. 30 tablet 1   No Known Allergies  I have reviewed patient's Past Medical Hx, Surgical Hx, Family Hx, Social Hx, medications  and allergies.   ROS:  Review of Systems  Constitutional: Negative for fever.  Gastrointestinal: Positive for abdominal pain. Negative for constipation, diarrhea, nausea and vomiting.  Genitourinary: Positive for missed menses, pelvic pain and vaginal bleeding.  Musculoskeletal: Negative for myalgias.   Review of Systems  Other systems negative   Physical Exam  Physical Exam Patient Vitals for the past 24 hrs:  BP Temp Temp src Pulse Resp Weight  06/24/16 1546 148/82 99.1 F (37.3 C) Oral 101 16 165 lb 12  oz (75.2 kg)   Constitutional: Well-developed, well-nourished female in no acute distress.  Cardiovascular: normal rate Respiratory: normal effort GI: Abd soft, non-tender. Pos BS x 4 MS: Extremities nontender, no edema, normal ROM Neurologic: Alert and oriented x 4.  GU: Neg CVAT.  PELVIC EXAM: Cervix pink, visually closed, without lesion, scant white creamy discharge, vaginal walls and external genitalia normal Bimanual exam: Cervix 0/long/high, firm, anterior, neg CMT, uterus nontender, nonenlarged, adnexa without tenderness, enlargement, or mass   LAB RESULTS . Results for orders placed or performed during the hospital encounter of 06/24/16 (from the past 24 hour(s))  Urinalysis, Routine w reflex microscopic     Status: None   Collection Time: 06/24/16  3:51 PM  Result Value Ref Range   Color, Urine YELLOW YELLOW   APPearance CLEAR CLEAR   Specific Gravity, Urine 1.025 1.005 - 1.030   pH 6.0 5.0 - 8.0   Glucose, UA NEGATIVE NEGATIVE mg/dL   Hgb urine dipstick NEGATIVE NEGATIVE   Bilirubin Urine NEGATIVE NEGATIVE   Ketones, ur NEGATIVE NEGATIVE mg/dL   Protein, ur NEGATIVE NEGATIVE mg/dL   Nitrite NEGATIVE NEGATIVE   Leukocytes, UA NEGATIVE NEGATIVE  CBC     Status: Abnormal   Collection Time: 06/24/16  4:15 PM  Result Value Ref Range   WBC 9.4 4.0 - 10.5 K/uL   RBC 5.02 3.87 - 5.11 MIL/uL   Hemoglobin 9.1 (L) 12.0 - 15.0 g/dL   HCT 47.830.9 (L) 29.536.0 - 62.146.0 %   MCV 61.6 (L) 78.0 - 100.0 fL   MCH 18.1 (L) 26.0 - 34.0 pg   MCHC 29.4 (L) 30.0 - 36.0 g/dL   RDW 30.826.1 (H) 65.711.5 - 84.615.5 %   Platelets 349 150 - 400 K/uL  hCG, quantitative, pregnancy     Status: Abnormal   Collection Time: 06/24/16  4:16 PM  Result Value Ref Range   hCG, Beta Chain, Quant, S 3,371 (H) <5 mIU/mL  Wet prep, genital     Status: Abnormal   Collection Time: 06/24/16  5:31 PM  Result Value Ref Range   Yeast Wet Prep HPF POC NONE SEEN NONE SEEN   Trich, Wet Prep NONE SEEN NONE SEEN   Clue Cells Wet  Prep HPF POC PRESENT (A) NONE SEEN   WBC, Wet Prep HPF POC NONE SEEN NONE SEEN   Sperm NONE SEEN        IMAGING [place] Ob Comp Less 14 Wks  Result Date: 06/24/2016 CLINICAL DATA:  Abdominal pain. Pregnant. Estimated gestational age by last menstrual period equals 5 weeks 6 days EXAM: OBSTETRIC <14 WK US AND TRANSVAGINAL OB US TECHNIQUE: Both transabdominal and transvaginal ultrasound examinations were performed for complete evaluation of the gestation as well as the maternal uterus, adnexal regions, and pelvic cul-de-sac. Transvaginal technique was performed to assess early pregnancy. COMPARISON:  Pelvic ultrasound 07/15/2012, CT 04/25/2010 FINDINGS: Intrauterine gestational sac: Single Yolk sac:  Present Embryo:  Not identified Cardiac Activity: Not identified Heart Rate:  bpm MSD: 8  mm   5 w   3  d CRL:    mm    w    d                  [place] EDC: Subchorionic hemorrhage:  None visualized. Maternal uterus/adnexae: Corpus luteal cyst of the RIGHT ovary. Rounded cystic structure of the LEFT ovary measures 5.5 x 4.5 x 5.1 cm. This has homogeneous internal echogenicity without blood flow. Lesion compares to 5.5 x 4.3 x 5.6 cm ultrasound of 07/15/2012. IMPRESSION: 1. Single ngle intrauterine gestational sac with yolk sac. 2. Estimated gestational age by mean sac diameter equals 5 weeks 3 days. 3. Large stable complex lesion of the LEFT ovary is favored an endometrium Electronically Signed   By: Genevive Bi M.D.   On: 06/24/2016 18:41   US Ob Transvaginal  Result Date: 06/24/2016 CLINICAL DATA:  Abdominal pain. Pregnant. Estimated gestational age by last menstrual period equals 5 weeks 6 days EXAM: OBSTETRIC <14 WK [place] AND TRANSVAGINAL OB US TECHNIQUE: Both transabdominal and transvaginal ultrasound examinations were performed for complete evaluation of the gestation as well as the maternal uterus, adnexal regions, and pelvic cul-de-sac. Transvaginal technique was performed to assess early pregnancy.  COMPARISON:  Pelvic ultrasound 07/15/2012, CT 04/25/2010 FINDINGS: Intrauterine gestational sac: Single Yolk sac:  Present Embryo:  Not identified Cardiac Activity: Not identified Heart Rate:   bpm MSD: 8  mm   5 w   3  d CRL:    mm    w    d                  [place] EDC: Subchorionic hemorrhage:  None visualized. Maternal uterus/adnexae: Corpus luteal cyst of the RIGHT ovary. Rounded cystic structure of the LEFT ovary measures 5.5 x 4.5 x 5.1 cm. This has homogeneous internal echogenicity without blood flow. Lesion compares to 5.5 x 4.3 x 5.6 cm ultrasound of 07/15/2012. IMPRESSION: 1. Single ngle intrauterine gestational sac with yolk sac. 2. Estimated gestational age by mean sac diameter equals 5 weeks 3 days. 3. Large stable complex lesion of the LEFT ovary is favored an endometrium Electronically Signed   By: Genevive Bi M.D.   On: 06/24/2016 18:41    MAU Management/MDM: Ordered usual first trimester r/o ectopic labs.   Pelvic exam and cultures done Will check baseline Ultrasound to rule out ectopic.  This bleeding/pain can represent a normal pregnancy with bleeding, spontaneous abortion or even an ectopic which can be life-threatening.  The process as listed above helps to determine which of these is present.  US showed single gestational sac with yolk sac which effectively rules out ectopic pregnancy The left ovarian cyst is stable over time Discussed BV and treatment plan Plans to call [facility] for prenatal care arrangements  ASSESSMENT Single IUP at [redacted]w[redacted]d by LMP, [redacted]w[redacted]d by [place] Spotting first trimester   PLAN Discharge home Reassured there is no ectopic pregnancy Rx Flagyl for BV Encouraged her to seek prenatal care  Pt stable at time of discharge. Encouraged to return here or to other Urgent Care/ED if she develops worsening of symptoms, increase in pain, fever, or other concerning symptoms.    Wynelle Bourgeois CNM, MSN Certified Nurse-Midwife 06/24/2016  5:21 PM

## 2016-06-24 NOTE — Discharge Instructions (Signed)
Bacterial Vaginosis Bacterial vaginosis is an infection of the vagina. It happens when too many germs (bacteria) grow in the vagina. This infection puts you at risk for infections from sex (STIs). Treating this infection can lower your risk for some STIs. You should also treat this if you are pregnant. It can cause your baby to be born early. Follow these instructions at home: Medicines  Take over-the-counter and prescription medicines only as told by your doctor.  Take or use your antibiotic medicine as told by your doctor. Do not stop taking or using it even if you start to feel better. General instructions  If you your sexual partner is a woman, tell her that you have this infection. She needs to get treatment if she has symptoms. If you have a female partner, he does not need to be treated.  During treatment:  Avoid sex.  Do not douche.  Avoid alcohol as told.  Avoid breastfeeding as told.  Drink enough fluid to keep your pee (urine) clear or pale yellow.  Keep your vagina and butt (rectum) clean.  Wash the area with warm water every day.  Wipe from front to back after you use the toilet.  Keep all follow-up visits as told by your doctor. This is important. Preventing this condition  Do not douche.  Use only warm water to wash around your vagina.  Use protection when you have sex. This includes:  Latex condoms.  Dental dams.  Limit how many people you have sex with. It is best to only have sex with the same person (be monogamous).  Get tested for STIs. Have your partner get tested.  Wear underwear that is cotton or lined with cotton.  Avoid tight pants and pantyhose. This is most important in summer.  Do not use any products that have nicotine or tobacco in them. These include cigarettes and e-cigarettes. If you need help quitting, ask your doctor.  Do not use illegal drugs.  Limit how much alcohol you drink. Contact a doctor if:  Your symptoms do not get  better, even after you are treated.  You have more discharge or pain when you pee (urinate).  You have a fever.  You have pain in your belly (abdomen).  You have pain with sex.  Your bleed from your vagina between periods. Summary  This infection happens when too many germs (bacteria) grow in the vagina.  Treating this condition can lower your risk for some infections from sex (STIs).  You should also treat this if you are pregnant. It can cause early (premature) birth.  Do not stop taking or using your antibiotic medicine even if you start to feel better. This information is not intended to replace advice given to you by your health care provider. Make sure you discuss any questions you have with your health care provider. Document Released: 03/01/2008 Document Revised: 02/06/2016 Document Reviewed: 02/06/2016 Elsevier Interactive Patient Education  2017 [employer] First Trimester of Pregnancy The first trimester of pregnancy is from week 1 until the end of week 12 (months 1 through 3). During this time, your baby will begin to develop inside you. At 6-8 weeks, the eyes and face are formed, and the heartbeat can be seen on ultrasound. At the end of 12 weeks, all the baby's organs are formed. Prenatal care is all the medical care you receive before the birth of your baby. Make sure you get good prenatal care and follow all of your doctor's instructions. Follow these instructions  at home: Medicines  Take medicine only as told by your doctor. Some medicines are safe and some are not during pregnancy.  Take your prenatal vitamins as told by your doctor.  Take medicine that helps you poop (stool softener) as needed if your doctor says it is okay. Diet  Eat regular, healthy meals.  Your doctor will tell you the amount of weight gain that is right for you.  Avoid raw meat and uncooked cheese.  If you feel sick to your stomach (nauseous) or throw up (vomit):  Eat 4 or 5 small  meals a day instead of 3 large meals.  Try eating a few soda crackers.  Drink liquids between meals instead of during meals.  If you have a hard time pooping (constipation):  Eat high-fiber foods like fresh vegetables, fruit, and whole grains.  Drink enough fluids to keep your pee (urine) clear or pale yellow. Activity and Exercise  Exercise only as told by your doctor. Stop exercising if you have cramps or pain in your lower belly (abdomen) or low back.  Try to avoid standing for long periods of time. Move your legs often if you must stand in one place for a long time.  Avoid heavy lifting.  Wear low-heeled shoes. Sit and stand up straight.  You can have sex unless your doctor tells you not to. Relief of Pain or Discomfort  Wear a good support bra if your breasts are sore.  Take warm water baths (sitz baths) to soothe pain or discomfort caused by hemorrhoids. Use hemorrhoid cream if your doctor says it is okay.  Rest with your legs raised if you have leg cramps or low back pain.  Wear support hose if you have puffy, bulging veins (varicose veins) in your legs. Raise (elevate) your feet for 15 minutes, 3-4 times a day. Limit salt in your diet. Prenatal Care  Schedule your prenatal visits by the twelfth week of pregnancy.  Write down your questions. Take them to your prenatal visits.  Keep all your prenatal visits as told by your doctor. Safety  Wear your seat belt at all times when driving.  Make a list of emergency phone numbers. The list should include numbers for family, friends, the hospital, and police and fire departments. General Tips  Ask your doctor for a referral to a local prenatal class. Begin classes no later than at the start of month 6 of your pregnancy.  Ask for help if you need counseling or help with nutrition. Your doctor can give you advice or tell you where to go for help.  Do not use hot tubs, steam rooms, or saunas.  Do not douche or use  tampons or scented sanitary pads.  Do not cross your legs for long periods of time.  Avoid litter boxes and soil used by cats.  Avoid all smoking, herbs, and alcohol. Avoid drugs not approved by your doctor.  Do not use any tobacco products, including cigarettes, chewing tobacco, and electronic cigarettes. If you need help quitting, ask your doctor. You may get counseling or other support to help you quit.  Visit your dentist. At home, brush your teeth with a soft toothbrush. Be gentle when you floss. Get help if:  You are dizzy.  You have mild cramps or pressure in your lower belly.  You have a nagging pain in your belly area.  You continue to feel sick to your stomach, throw up, or have watery poop (diarrhea).  You have a bad smelling  fluid coming from your vagina.  You have pain with peeing (urination).  You have increased puffiness (swelling) in your face, hands, legs, or ankles. Get help right away if:  You have a fever.  You are leaking fluid from your vagina.  You have spotting or bleeding from your vagina.  You have very bad belly cramping or pain.  You gain or lose weight rapidly.  You throw up blood. It may look like coffee grounds.  You are around people who have [place] measles, fifth disease, or chickenpox.  You have a very bad headache.  You have shortness of breath.  You have any kind of trauma, such as from a fall or a car accident. This information is not intended to replace advice given to you by your health care provider. Make sure you discuss any questions you have with your health care provider. Document Released: 11/09/2007 Document Revised: 10/29/2015 Document Reviewed: 04/02/2013 Elsevier Interactive Patient Education  2017 [employer].

## 2016-06-25 LAB — HIV ANTIBODY (ROUTINE TESTING W REFLEX): HIV Screen 4th Generation wRfx: NONREACTIVE

## 2016-06-27 LAB — GC/CHLAMYDIA PROBE AMP (~~LOC~~) NOT AT ARMC
Chlamydia: NEGATIVE
Neisseria Gonorrhea: NEGATIVE

## 2016-07-06 ENCOUNTER — Emergency Department (HOSPITAL_BASED_OUTPATIENT_CLINIC_OR_DEPARTMENT_OTHER)
Admission: EM | Admit: 2016-07-06 | Discharge: 2016-07-06 | Disposition: A | Discharge status: Home / Self Care | Payer: Medicaid Other | Attending: Emergency Medicine | Admitting: Emergency Medicine

## 2016-07-06 ENCOUNTER — Emergency Department (HOSPITAL_BASED_OUTPATIENT_CLINIC_OR_DEPARTMENT_OTHER): Payer: Medicaid Other

## 2016-07-06 ENCOUNTER — Encounter (HOSPITAL_BASED_OUTPATIENT_CLINIC_OR_DEPARTMENT_OTHER): Payer: Self-pay | Admitting: Emergency Medicine

## 2016-07-06 DIAGNOSIS — Z3A01 Less than 8 weeks gestation of pregnancy: Secondary | ICD-10-CM | POA: Insufficient documentation

## 2016-07-06 DIAGNOSIS — O209 Hemorrhage in early pregnancy, unspecified: Secondary | ICD-10-CM | POA: Diagnosis not present

## 2016-07-06 DIAGNOSIS — Z79899 Other long term (current) drug therapy: Secondary | ICD-10-CM | POA: Insufficient documentation

## 2016-07-06 DIAGNOSIS — I10 Essential (primary) hypertension: Secondary | ICD-10-CM | POA: Insufficient documentation

## 2016-07-06 DIAGNOSIS — Z87891 Personal history of nicotine dependence: Secondary | ICD-10-CM | POA: Insufficient documentation

## 2016-07-06 DIAGNOSIS — R102 Pelvic and perineal pain: Secondary | ICD-10-CM | POA: Diagnosis not present

## 2016-07-06 LAB — BASIC METABOLIC PANEL
ANION GAP: 4 — AB (ref 5–15)
BUN: 10 mg/dL (ref 6–20)
CHLORIDE: 105 mmol/L (ref 101–111)
CO2: 27 mmol/L (ref 22–32)
Calcium: 9.3 mg/dL (ref 8.9–10.3)
Creatinine, Ser: 0.62 mg/dL (ref 0.44–1.00)
GFR calc Af Amer: >60 mL/min (ref >60)
GFR calc non Af Amer: >60 mL/min (ref >60)
GLUCOSE: 70 mg/dL (ref 65–99)
POTASSIUM: 3.5 mmol/L (ref 3.5–5.1)
Sodium: 136 mmol/L (ref 135–145)

## 2016-07-06 LAB — URINALYSIS, ROUTINE W REFLEX MICROSCOPIC
BILIRUBIN URINE: NEGATIVE
GLUCOSE, UA: NEGATIVE mg/dL
KETONES UR: NEGATIVE mg/dL
LEUKOCYTES UA: NEGATIVE
Nitrite: NEGATIVE
PH: 7.5 (ref 5.0–8.0)
Protein, ur: NEGATIVE mg/dL
SPECIFIC GRAVITY, URINE: 1.021 (ref 1.005–1.030)

## 2016-07-06 LAB — CBC WITH DIFFERENTIAL/PLATELET
BASOS ABS: 0.1 10*3/uL (ref 0.0–0.1)
BASOS PCT: 1 %
EOS PCT: 1 %
Eosinophils Absolute: 0.1 10*3/uL (ref 0.0–0.7)
HEMATOCRIT: 33.9 % — AB (ref 36.0–46.0)
HEMOGLOBIN: 10.5 g/dL — AB (ref 12.0–15.0)
LYMPHS ABS: 2 10*3/uL (ref 0.7–4.0)
Lymphocytes Relative: 24 %
MCH: 19.2 pg — AB (ref 26.0–34.0)
MCHC: 31 g/dL (ref 30.0–36.0)
MCV: 62 fL — AB (ref 78.0–100.0)
MONOS PCT: 5 %
Monocytes Absolute: 0.4 10*3/uL (ref 0.1–1.0)
Neutro Abs: 5.8 10*3/uL (ref 1.7–7.7)
Neutrophils Relative %: 69 %
Platelets: 258 10*3/uL (ref 150–400)
RBC: 5.47 MIL/uL — ABNORMAL HIGH (ref 3.87–5.11)
RDW: 29.8 % — ABNORMAL HIGH (ref 11.5–15.5)
WBC: 8.4 10*3/uL (ref 4.0–10.5)

## 2016-07-06 LAB — URINALYSIS, MICROSCOPIC (REFLEX)

## 2016-07-06 LAB — WET PREP, GENITAL
CLUE CELLS WET PREP: NONE SEEN
Sperm: NONE SEEN
Trich, Wet Prep: NONE SEEN
WBC WET PREP: NONE SEEN
YEAST WET PREP: NONE SEEN

## 2016-07-06 LAB — RH IG WORKUP (INCLUDES ABO/RH)
ABO/RH(D): O POS
Gestational Age(Wks): 7

## 2016-07-06 LAB — HCG, QUANTITATIVE, PREGNANCY: HCG, BETA CHAIN, QUANT, S: 13181 m[IU]/mL — AB (ref <5)

## 2016-07-06 NOTE — Discharge Instructions (Signed)
Your blood work today looked good.  Baby still looks healthy on ultrasound, no complications seen. Continue your prenatal vitamins.   Recommend pelvic rest to help resolve bleeding. Follow-up with [facility] clinic for ongoing prenatal care.

## 2016-07-06 NOTE — ED Triage Notes (Signed)
[redacted] weeks pregnant  Vag bleding x 2 days now says something came out while in shower

## 2016-07-06 NOTE — ED Provider Notes (Signed)
[facility]   CSN: [id number] Arrival date & time: 07/06/16  1051     History   Chief Complaint Chief Complaint  Patient presents with  . Vaginal Bleeding    HPI Jennifer Roberson is a 26 y.o. female.  The history is provided by the patient and medical records.  Vaginal Bleeding  Primary symptoms include pelvic pain (cramping), vaginal bleeding.     26 y.o. F G1P0 approx [redacted] weeks gestation here with abdominal cramping and vaginal bleeding.  States she has had prior ultrasound on 1/191/8 at [facility] for pregnancy confirmation which confirmed IUP.  States 2 days ago she developed some bleeding.  States at first it was only with going to the bathroom, now it is all the time.  Denies passage of clots.  States "something came out" in the shower this morning but unsure what it was and it went down the drain.  She reports lower abdominal cramping.  No nausea, vomiting, or diarrhea.  No fever.  No urinary symptoms.  No discharge.    Past Medical History:  Diagnosis Date  . Eczema   . Hypertension   . Miscarriage   . Seasonal allergies   . Trichomonas     Patient Active Problem List   Diagnosis Date Noted  . Left ovarian cyst 06/24/2016  . Chronic hypertension 06/24/2016    Past Surgical History:  Procedure Laterality Date  . NO PAST SURGERIES      OB History    Gravida Para Term Preterm AB Living   2       1     SAB TAB Ectopic Multiple Live Births   0   1   0       Home Medications    Prior to Admission medications   Medication Sig Start Date End Date Taking? Authorizing Provider  ferrous sulfate 325 (65 FE) MG EC tablet Take 325 mg by mouth 3 (three) times daily with meals.    Historical Provider, MD  metroNIDAZOLE (FLAGYL) 500 MG tablet Take 1 tablet (500 mg total) by mouth 2 (two) times daily. One po bid x 7 days 03/16/16   Fayrene HelperBowie Tran, PA-C  Prenatal Vit-Fe Fumarate-FA (PRENATAL VITAMINS) 28-0.8 MG TABS Take 1 tablet by mouth as directed.  06/14/16   Everlene FarrierWilliam Dansie, PA-C  ranitidine (ZANTAC) 150 MG tablet Take 1 tablet (150 mg total) by mouth at bedtime. 06/14/16   Everlene FarrierWilliam Dansie, PA-C    Family History Family History  Problem Relation Age of Onset  . Hypertension Maternal Grandfather   . Diabetes Maternal Grandfather   . Asthma Father     Social History Social History  Substance Use Topics  . Smoking status: Former Smoker    Packs/day: 0.50    Types: Cigarettes    Quit date: 06/06/2016  . Smokeless tobacco: Never Used  . Alcohol use No     Comment: socially none since pregnancy     Allergies   Patient has no known allergies.   Review of Systems Review of Systems  Genitourinary: Positive for pelvic pain (cramping) and vaginal bleeding.  All other systems reviewed and are negative.    Physical Exam Updated Vital Signs BP 146/86 (BP Location: Left Arm)   Pulse 87   Temp 98.8 F (37.1 C) (Oral)   Resp 20   LMP 05/14/2016 (Exact Date)   SpO2 100%   Physical Exam  Constitutional: She is oriented to person, place, and time. She appears well-developed and  well-nourished.  HENT:  Head: Normocephalic and atraumatic.  Mouth/Throat: Oropharynx is clear and moist.  Eyes: Conjunctivae and EOM are normal. Pupils are equal, round, and reactive to light.  Neck: Normal range of motion.  Cardiovascular: Normal rate, regular rhythm and normal heart sounds.   Pulmonary/Chest: Effort normal and breath sounds normal. No respiratory distress. She has no wheezes.  Abdominal: Soft. Bowel sounds are normal. There is no tenderness. There is no rigidity, no rebound and no guarding.  Genitourinary:  Genitourinary Comments: Exam chaperoned by RN Normal female external genitalia without visible lesions or rash; scant amount of blood noted in the vaginal vault without active bleeding; no clots; cervical os is closed; no appreciable discharge  Musculoskeletal: Normal range of motion.  Neurological: She is alert and oriented to  person, place, and time.  Skin: Skin is warm and dry.  Psychiatric: She has a normal mood and affect.  Nursing note and vitals reviewed.    ED Treatments / Results  Labs (all labs ordered are listed, but only abnormal results are displayed) Labs Reviewed  CBC WITH DIFFERENTIAL/PLATELET - Abnormal; Notable for the following:       Result Value   RBC 5.47 (*)    Hemoglobin 10.5 (*)    HCT 33.9 (*)    MCV 62.0 (*)    MCH 19.2 (*)    RDW 29.8 (*)    All other components within normal limits  BASIC METABOLIC PANEL - Abnormal; Notable for the following:    Anion gap 4 (*)    All other components within normal limits  HCG, QUANTITATIVE, PREGNANCY - Abnormal; Notable for the following:    hCG, Beta Chain, Quant, S 13,181 (*)    All other components within normal limits  URINALYSIS, ROUTINE W REFLEX MICROSCOPIC - Abnormal; Notable for the following:    Hgb urine dipstick TRACE (*)    All other components within normal limits  URINALYSIS, MICROSCOPIC (REFLEX) - Abnormal; Notable for the following:    Bacteria, UA RARE (*)    Squamous Epithelial / LPF 0-5 (*)    All other components within normal limits  WET PREP, GENITAL  RH IG WORKUP (INCLUDES ABO/RH)  GC/CHLAMYDIA PROBE AMP ([place]) NOT AT [facility]    EKG  EKG Interpretation None       Radiology US Ob Comp Less 14 Wks  Result Date: 07/06/2016 CLINICAL DATA:  Vaginal bleeding. EXAM: OBSTETRIC <14 WK [place] AND TRANSVAGINAL OB US TECHNIQUE: Both transabdominal and transvaginal ultrasound examinations were performed for complete evaluation of the gestation as well as the maternal uterus, adnexal regions, and pelvic cul-de-sac. Transvaginal technique was performed to assess early pregnancy. COMPARISON:  06/24/2016.  07/15/2012. FINDINGS: Intrauterine gestational sac: Single Yolk sac:  Present Embryo:  Present Cardiac Activity: Present Heart Rate: 108  bpm CRL:  0.67 cm mm 6 w   4 d                  [place] EDC: 02/25/2017 Subchorionic  hemorrhage:  None visualized. Maternal uterus/adnexae: 5.7 x 5.1 x 5.0 cm hypoechoic mass again in the left ovary. This is stable from prior studies dating to 07/15/2012. This is most likely a benign process such as endometrioma. Trace free pelvic fluid. IMPRESSION: 1. Single viable injury pregnancy at 6 weeks 4 days. Fetal heart rate of 108 per beats per minute noted . Continued follow-up suggested. 2. Stable left ovarian 5.7 cm hypoechoic mass, most likely a benign process such as endometrioma. 3. Trace  free pelvic fluid . Electronically Signed   By: Maisie Fus  Register   On: 07/06/2016 14:23   US Ob Transvaginal  Result Date: 07/06/2016 CLINICAL DATA:  Vaginal bleeding. EXAM: OBSTETRIC <14 WK [place] AND TRANSVAGINAL OB US TECHNIQUE: Both transabdominal and transvaginal ultrasound examinations were performed for complete evaluation of the gestation as well as the maternal uterus, adnexal regions, and pelvic cul-de-sac. Transvaginal technique was performed to assess early pregnancy. COMPARISON:  06/24/2016.  07/15/2012. FINDINGS: Intrauterine gestational sac: Single Yolk sac:  Present Embryo:  Present Cardiac Activity: Present Heart Rate: 108  bpm CRL:  0.67 cm mm 6 w   4 d                  [place] EDC: 02/25/2017 Subchorionic hemorrhage:  None visualized. Maternal uterus/adnexae: 5.7 x 5.1 x 5.0 cm hypoechoic mass again in the left ovary. This is stable from prior studies dating to 07/15/2012. This is most likely a benign process such as endometrioma. Trace free pelvic fluid. IMPRESSION: 1. Single viable injury pregnancy at 6 weeks 4 days. Fetal heart rate of 108 per beats per minute noted . Continued follow-up suggested. 2. Stable left ovarian 5.7 cm hypoechoic mass, most likely a benign process such as endometrioma. 3. Trace free pelvic fluid . Electronically Signed   By: Maisie Fus  Register   On: 07/06/2016 14:23    Procedures Procedures (including critical care time)  Medications Ordered in ED Medications - No  data to display   Initial Impression / Assessment and Plan / ED Course  I have reviewed the triage vital signs and the nursing notes.  Pertinent labs & imaging results that were available during my care of the patient were reviewed by me and considered in my medical decision making (see chart for details).  26 year old G1 P0 approximately [redacted] weeks gestation here with vaginal bleeding. For some lower abdominal cramping. She is afebrile and nontoxic. Abdomen is soft and benign. Pelvic exam with scant amount of blood in the vaginal vault, however no active bleeding. No clots. Cervical os remains closed. Labwork is overall reassuring. Patient is O+, not a candidate for Rhogam.  US obtained, single viable IUP noted with good HR.  No fetal complications noted.  She does have left ovarian hypoechoic mass of left ovary which was seen on prior US as well, felt likely to be endometrioma.  Recommended close FU with OB-GYN, pelvic rest, bleeding precautions.  Discussed plan with patient, she acknowledged understanding and agreed with plan of care.  Return precautions given for new or worsening symptoms.  Final Clinical Impressions(s) / ED Diagnoses   Final diagnoses:  Vaginal bleeding in pregnancy, first trimester    New Prescriptions New Prescriptions   No medications on file     Garlon Hatchet, PA-C 07/06/16 1517    Cathren Laine, MD 07/09/16 [phone]

## 2016-07-07 LAB — CERVICOVAGINAL ANCILLARY ONLY
CHLAMYDIA, DNA PROBE: NEGATIVE
NEISSERIA GONORRHEA: NEGATIVE

## 2016-07-11 ENCOUNTER — Encounter (HOSPITAL_BASED_OUTPATIENT_CLINIC_OR_DEPARTMENT_OTHER): Payer: Self-pay | Admitting: *Deleted

## 2016-07-11 ENCOUNTER — Emergency Department (HOSPITAL_BASED_OUTPATIENT_CLINIC_OR_DEPARTMENT_OTHER)
Admission: EM | Admit: 2016-07-11 | Discharge: 2016-07-11 | Disposition: A | Discharge status: Home / Self Care | Payer: Medicaid Other | Attending: Emergency Medicine | Admitting: Emergency Medicine

## 2016-07-11 DIAGNOSIS — Z79899 Other long term (current) drug therapy: Secondary | ICD-10-CM | POA: Diagnosis not present

## 2016-07-11 DIAGNOSIS — Z87891 Personal history of nicotine dependence: Secondary | ICD-10-CM | POA: Diagnosis not present

## 2016-07-11 DIAGNOSIS — O200 Threatened abortion: Secondary | ICD-10-CM | POA: Diagnosis not present

## 2016-07-11 DIAGNOSIS — O209 Hemorrhage in early pregnancy, unspecified: Secondary | ICD-10-CM | POA: Diagnosis present

## 2016-07-11 DIAGNOSIS — Z3A08 8 weeks gestation of pregnancy: Secondary | ICD-10-CM | POA: Diagnosis not present

## 2016-07-11 DIAGNOSIS — I10 Essential (primary) hypertension: Secondary | ICD-10-CM | POA: Insufficient documentation

## 2016-07-11 LAB — URINALYSIS, ROUTINE W REFLEX MICROSCOPIC
Bilirubin Urine: NEGATIVE
GLUCOSE, UA: NEGATIVE mg/dL
KETONES UR: NEGATIVE mg/dL
Leukocytes, UA: NEGATIVE
Nitrite: NEGATIVE
PH: 7 (ref 5.0–8.0)
Protein, ur: NEGATIVE mg/dL
Specific Gravity, Urine: 1.023 (ref 1.005–1.030)

## 2016-07-11 LAB — URINALYSIS, MICROSCOPIC (REFLEX)

## 2016-07-11 LAB — CBC
HCT: 33.3 % — ABNORMAL LOW (ref 36.0–46.0)
Hemoglobin: 10.2 g/dL — ABNORMAL LOW (ref 12.0–15.0)
MCH: 19.8 pg — ABNORMAL LOW (ref 26.0–34.0)
MCHC: 30.6 g/dL (ref 30.0–36.0)
MCV: 64.5 fL — AB (ref 78.0–100.0)
PLATELETS: 256 10*3/uL (ref 150–400)
RBC: 5.16 MIL/uL — ABNORMAL HIGH (ref 3.87–5.11)
RDW: 29.9 % — ABNORMAL HIGH (ref 11.5–15.5)
WBC: 6.8 10*3/uL (ref 4.0–10.5)

## 2016-07-11 LAB — HCG, QUANTITATIVE, PREGNANCY: HCG, BETA CHAIN, QUANT, S: 16497 m[IU]/mL — AB (ref <5)

## 2016-07-11 NOTE — ED Triage Notes (Signed)
Pt reports she is 2 months pregnant and has had vag spotting onset yesterday, this morning passing clots with cramping. Pt states she is not using peri pads, "I just put some tissue in there to catch it." pt states she was seen here a few weeks ago for same and was told not to have sex.

## 2016-07-11 NOTE — ED Provider Notes (Signed)
[facility] Provider Note   CSN: [id number] Arrival date & time: 07/11/16 0750     History    Chief Complaint  Patient presents with  . Vaginal Bleeding     HPI Jennifer Roberson is a 26 y.o. female.  25yo F G2P0010 at 7 wk 2d by [place] w/ h/o previous ectopic pregnancy who p/w vaginal bleeding and cramping. Pt was seen here on 1/31 for vaginal spotting. Work up confirmed IUP, instructed to f/u w/ OBGYN. She has an appt scheduled in 4 days. Last night, she began having vaginal spotting and intermittent moderate to severe cramping similar to menstruation. Today she has been passing clots and has mild suprapubic cramping/pressure. She denies any vomiting, diarrhea, urinary symptoms, fevers, or recent illness. She has been compliant with pelvic rest since previous visit.   Past Medical History:  Diagnosis Date  . Eczema   . Hypertension   . Miscarriage   . Seasonal allergies   . Trichomonas      Patient Active Problem List   Diagnosis Date Noted  . Left ovarian cyst 06/24/2016  . Chronic hypertension 06/24/2016    Past Surgical History:  Procedure Laterality Date  . NO PAST SURGERIES      OB History    Gravida Para Term Preterm AB Living   2       1     SAB TAB Ectopic Multiple Live Births   0   1   0        Home Medications    Prior to Admission medications   Medication Sig Start Date End Date Taking? Authorizing Provider  ferrous sulfate 325 (65 FE) MG EC tablet Take 325 mg by mouth 3 (three) times daily with meals.    Historical Provider, MD  metroNIDAZOLE (FLAGYL) 500 MG tablet Take 1 tablet (500 mg total) by mouth 2 (two) times daily. One po bid x 7 days 03/16/16   Fayrene Helper, PA-C  Prenatal Vit-Fe Fumarate-FA (PRENATAL VITAMINS) 28-0.8 MG TABS Take 1 tablet by mouth as directed. 06/14/16   [name], PA-C  ranitidine (ZANTAC) 150 MG tablet Take 1 tablet (150 mg total) by mouth at bedtime. 06/14/16   [name], PA-C      Family History    Problem Relation Age of Onset  . Hypertension Maternal Grandfather   . Diabetes Maternal Grandfather   . Asthma Father      Social History  Substance Use Topics  . Smoking status: Former Smoker    Packs/day: 0.50    Types: Cigarettes    Quit date: 06/06/2016  . Smokeless tobacco: Never Used  . Alcohol use No     Comment: socially none since pregnancy     Allergies     Patient has no known allergies.    Review of Systems  10 Systems reviewed and are negative for acute change except as noted in the HPI.   Physical Exam Updated Vital Signs BP 139/84 (BP Location: Left Arm)   Pulse 79   Temp 98.1 F (36.7 C) (Oral)   Resp 18   Ht 5\' 11"  (1.803 m)   Wt 161 lb (73 kg)   LMP 05/14/2016 (Exact Date)   SpO2 100%   BMI 22.45 kg/m   Physical Exam  Constitutional: She is oriented to person, place, and time. She appears well-developed and well-nourished. No distress.  HENT:  Head: Normocephalic and atraumatic.  Moist mucous membranes  Eyes: Conjunctivae are normal. Pupils are equal, round, and  reactive to light.  Neck: Neck supple.  Cardiovascular: Normal rate, regular rhythm and normal heart sounds.   No murmur heard. Pulmonary/Chest: Effort normal and breath sounds normal.  Abdominal: Soft. Bowel sounds are normal. She exhibits no distension. There is no tenderness.  Genitourinary: Vagina normal.  Genitourinary Comments: Moderate amount of dark blood in vaginal vault with small amount of blood coming through os, no cervical motion or adnexal tenderness  Musculoskeletal: She exhibits no edema.  Neurological: She is alert and oriented to person, place, and time.  Fluent speech Normal gait  Skin: Skin is warm and dry.  Psychiatric: She has a normal mood and affect. Judgment normal.  Nursing note and vitals reviewed.  Chaperone was present during exam.    ED Treatments / Results  Labs (all labs ordered are listed, but only abnormal results are displayed) Labs  Reviewed  CBC - Abnormal; Notable for the following:       Result Value   RBC 5.16 (*)    Hemoglobin 10.2 (*)    HCT 33.3 (*)    MCV 64.5 (*)    MCH 19.8 (*)    RDW 29.9 (*)    All other components within normal limits  HCG, QUANTITATIVE, PREGNANCY - Abnormal; Notable for the following:    hCG, Beta Chain, Quant, S 16,497 (*)    All other components within normal limits  URINALYSIS, ROUTINE W REFLEX MICROSCOPIC - Abnormal; Notable for the following:    Hgb urine dipstick SMALL (*)    All other components within normal limits  URINALYSIS, MICROSCOPIC (REFLEX) - Abnormal; Notable for the following:    Bacteria, UA MANY (*)    Squamous Epithelial / LPF 0-5 (*)    All other components within normal limits  GC/CHLAMYDIA PROBE AMP ([place]) [facility]     EKG  EKG Interpretation  Date/Time:    Ventricular Rate:    PR Interval:    QRS Duration:   QT Interval:    QTC Calculation:   R Axis:     Text Interpretation:           Radiology No results found.  Procedures Procedures (including critical care time) Procedures  Medications Ordered in ED  Medications - No data to display   Initial Impression / Assessment and Plan / ED Course  I have reviewed the triage vital signs and the nursing notes.  Pertinent labs  that were available during my care of the patient were reviewed by me and considered in my medical decision making (see chart for details).     PT currently 7 wk by US p/w vaginal spotting and cramps since last night. She was well-appearing on exam with reassuring vital signs. No abdominal tenderness. Pelvic exam did show a moderate amount of blood in the vaginal vault with some blood coming through the cervical os. Exam concerning for spontaneous abortion.  Chart review shows last ultrasound confirmed intrauterine pregnancy, FHR 108 at that time. Pt is O+ thus no indication for Rhogam. BHCG today is 16,497, higher than previous therefore not definite  suggestion of spontaneous abortion although I am concerned about miscarriage given exam. She has an US and OB appt scheduled for this week. She is hemodynamically stable with a hemoglobin of 10.2 consistent with previous therefore I feel she is safe for discharge. I have discussed supportive care and emphasized return precautions including any heavy vaginal bleeding or severe worsening of pain. Patient will follow-up in a few days with OB/GYN.  Patient voiced understanding of plan and was discharged in satisfactory condition.  Final Clinical Impressions(s) / ED Diagnoses   Final diagnoses:  Threatened miscarriage     New Prescriptions   No medications on file       Laurence Spates, MD 07/11/16 1024

## 2016-07-11 NOTE — ED Notes (Signed)
ED Provider at bedside. 

## 2016-07-12 LAB — GC/CHLAMYDIA PROBE AMP (~~LOC~~) NOT AT ARMC
Chlamydia: NEGATIVE
Neisseria Gonorrhea: NEGATIVE

## 2016-07-15 ENCOUNTER — Ambulatory Visit (HOSPITAL_COMMUNITY): Payer: Medicaid Other

## 2017-01-09 ENCOUNTER — Encounter (HOSPITAL_BASED_OUTPATIENT_CLINIC_OR_DEPARTMENT_OTHER): Payer: Self-pay

## 2017-01-09 ENCOUNTER — Encounter (HOSPITAL_BASED_OUTPATIENT_CLINIC_OR_DEPARTMENT_OTHER): Payer: Self-pay | Admitting: Emergency Medicine

## 2017-01-09 ENCOUNTER — Emergency Department (HOSPITAL_BASED_OUTPATIENT_CLINIC_OR_DEPARTMENT_OTHER)
Admission: EM | Admit: 2017-01-09 | Discharge: 2017-01-09 | Disposition: A | Discharge status: Home / Self Care | Payer: Medicaid Other | Attending: Emergency Medicine | Admitting: Emergency Medicine

## 2017-01-09 ENCOUNTER — Emergency Department (HOSPITAL_BASED_OUTPATIENT_CLINIC_OR_DEPARTMENT_OTHER)
Admission: EM | Admit: 2017-01-09 | Discharge: 2017-01-09 | Disposition: A | Discharge status: Home / Self Care | Payer: Medicaid Other | Source: Home / Self Care | Attending: Emergency Medicine | Admitting: Emergency Medicine

## 2017-01-09 DIAGNOSIS — Z202 Contact with and (suspected) exposure to infections with a predominantly sexual mode of transmission: Secondary | ICD-10-CM

## 2017-01-09 DIAGNOSIS — R21 Rash and other nonspecific skin eruption: Secondary | ICD-10-CM

## 2017-01-09 DIAGNOSIS — R102 Pelvic and perineal pain: Secondary | ICD-10-CM

## 2017-01-09 DIAGNOSIS — I10 Essential (primary) hypertension: Secondary | ICD-10-CM | POA: Insufficient documentation

## 2017-01-09 DIAGNOSIS — N760 Acute vaginitis: Secondary | ICD-10-CM | POA: Diagnosis not present

## 2017-01-09 DIAGNOSIS — Z87891 Personal history of nicotine dependence: Secondary | ICD-10-CM | POA: Diagnosis not present

## 2017-01-09 DIAGNOSIS — B9689 Other specified bacterial agents as the cause of diseases classified elsewhere: Secondary | ICD-10-CM

## 2017-01-09 DIAGNOSIS — R1030 Lower abdominal pain, unspecified: Secondary | ICD-10-CM | POA: Diagnosis present

## 2017-01-09 LAB — CBC WITH DIFFERENTIAL/PLATELET
Basophils Absolute: 0.1 10*3/uL (ref 0.0–0.1)
Basophils Relative: 1 %
Eosinophils Absolute: 0.2 10*3/uL (ref 0.0–0.7)
Eosinophils Relative: 3 %
HCT: 27.5 % — ABNORMAL LOW (ref 36.0–46.0)
Hemoglobin: 8.8 g/dL — ABNORMAL LOW (ref 12.0–15.0)
Lymphocytes Relative: 17 %
Lymphs Abs: 1.1 10*3/uL (ref 0.7–4.0)
MCH: 19.8 pg — ABNORMAL LOW (ref 26.0–34.0)
MCHC: 32 g/dL (ref 30.0–36.0)
MCV: 61.9 fL — ABNORMAL LOW (ref 78.0–100.0)
Monocytes Absolute: 0.5 10*3/uL (ref 0.1–1.0)
Monocytes Relative: 7 %
Neutro Abs: 4.7 10*3/uL (ref 1.7–7.7)
Neutrophils Relative %: 72 %
Platelets: 395 10*3/uL (ref 150–400)
RBC: 4.44 MIL/uL (ref 3.87–5.11)
RDW: 19.1 % — ABNORMAL HIGH (ref 11.5–15.5)
WBC: 6.5 10*3/uL (ref 4.0–10.5)

## 2017-01-09 LAB — COMPREHENSIVE METABOLIC PANEL
ALT: 11 U/L — ABNORMAL LOW (ref 14–54)
AST: 16 U/L (ref 15–41)
Albumin: 4 g/dL (ref 3.5–5.0)
Alkaline Phosphatase: 65 U/L (ref 38–126)
Anion gap: 7 (ref 5–15)
BUN: 11 mg/dL (ref 6–20)
CO2: 25 mmol/L (ref 22–32)
Calcium: 8.8 mg/dL — ABNORMAL LOW (ref 8.9–10.3)
Chloride: 105 mmol/L (ref 101–111)
Creatinine, Ser: 0.66 mg/dL (ref 0.44–1.00)
GFR calc Af Amer: >60 mL/min (ref >60)
GFR calc non Af Amer: >60 mL/min (ref >60)
Glucose, Bld: 87 mg/dL (ref 65–99)
Potassium: 3.5 mmol/L (ref 3.5–5.1)
Sodium: 137 mmol/L (ref 135–145)
Total Bilirubin: 1 mg/dL (ref 0.3–1.2)
Total Protein: 7.5 g/dL (ref 6.5–8.1)

## 2017-01-09 LAB — URINALYSIS, ROUTINE W REFLEX MICROSCOPIC
Bilirubin Urine: NEGATIVE
Glucose, UA: NEGATIVE mg/dL
Hgb urine dipstick: NEGATIVE
Ketones, ur: NEGATIVE mg/dL
LEUKOCYTES UA: NEGATIVE
NITRITE: NEGATIVE
PROTEIN: NEGATIVE mg/dL
Specific Gravity, Urine: 1.025 (ref 1.005–1.030)
pH: 6 (ref 5.0–8.0)

## 2017-01-09 LAB — WET PREP, GENITAL
Sperm: NONE SEEN
Trich, Wet Prep: NONE SEEN
Yeast Wet Prep HPF POC: NONE SEEN

## 2017-01-09 LAB — PREGNANCY, URINE: PREG TEST UR: NEGATIVE

## 2017-01-09 MED ORDER — AZITHROMYCIN 250 MG PO TABS
1000.0000 mg | ORAL_TABLET | Freq: Once | ORAL | Status: AC
  Administered 2017-01-09: 1000 mg via ORAL
  Filled 2017-01-09: qty 4

## 2017-01-09 MED ORDER — METRONIDAZOLE 500 MG PO TABS: 500.0000 mg | ORAL_TABLET | Freq: Two times a day (BID) | ORAL | 0 refills | Status: DC

## 2017-01-09 MED ORDER — CEFTRIAXONE SODIUM 250 MG IJ SOLR
250.0000 mg | Freq: Once | INTRAMUSCULAR | Status: AC
  Administered 2017-01-09: 250 mg via INTRAMUSCULAR
  Filled 2017-01-09: qty 250

## 2017-01-09 NOTE — ED Triage Notes (Signed)
Patient reports sister has syphillis and states that she eats and drinks after her and is worried that she now has syphillis due to exchange of saliva.  Reports vaginal discharge, lower abdominal pain and lesions to bottom of feet.

## 2017-01-09 NOTE — ED Provider Notes (Signed)
[facility] Provider Note   CSN: [id number] Arrival date & time: 01/09/17  1119     History   Chief Complaint Chief Complaint  Patient presents with  . Abdominal Pain    HPI Jennifer Roberson is a 26 y.o. female who presents with a three-day history of intermittent lower abdominal pain, vaginal discharge, and rash to bilateral feet. Patient reports that her sister was diagnosed and treated for syphilis and she reports she shares drinks, food, and cigarettes with her and is afraid she may have been infected as well. Patient is sexually active herself, but had not had any sexual relations for 2 or 3 months until 2 days ago. Patient denies any fevers, chest pain, shortness of breath, nausea, vomiting, abnormal vaginal bleeding, or urinary symptoms. She has had a associated headache with her symptoms. Patient LMP 12/26/2016. She has not taken any medications over-the-counter for her symptoms.  HPI  Past Medical History:  Diagnosis Date  . Eczema   . Hypertension   . Miscarriage   . Seasonal allergies   . Trichomonas     Patient Active Problem List   Diagnosis Date Noted  . Left ovarian cyst 06/24/2016  . Chronic hypertension 06/24/2016    Past Surgical History:  Procedure Laterality Date  . NO PAST SURGERIES      OB History    Gravida Para Term Preterm AB Living   2       1     SAB TAB Ectopic Multiple Live Births   0   1   0       Home Medications    Prior to Admission medications   Medication Sig Start Date End Date Taking? Authorizing Provider  metroNIDAZOLE (FLAGYL) 500 MG tablet Take 1 tablet (500 mg total) by mouth 2 (two) times daily. 01/09/17   Emi HolesLaw, Shaleah Nissley M, PA-C    Family History Family History  Problem Relation Age of Onset  . Hypertension Maternal Grandfather   . Diabetes Maternal Grandfather   . Asthma Father     Social History Social History  Substance Use Topics  . Smoking status: Former Smoker    Packs/day: 0.50    Types:  Cigarettes    Quit date: 06/06/2016  . Smokeless tobacco: Never Used  . Alcohol use No     Allergies   Patient has no known allergies.   Review of Systems Review of Systems  Constitutional: Negative for chills and fever.  HENT: Negative for facial swelling and sore throat.   Respiratory: Negative for shortness of breath.   Cardiovascular: Negative for chest pain.  Gastrointestinal: Negative for abdominal pain, nausea and vomiting.  Genitourinary: Positive for pelvic pain and vaginal discharge. Negative for dysuria and vaginal bleeding.  Musculoskeletal: Negative for back pain.  Skin: Positive for rash. Negative for wound.  Neurological: Positive for headaches.  Psychiatric/Behavioral: The patient is not nervous/anxious.      Physical Exam Updated Vital Signs BP 131/84 (BP Location: Right Arm)   Pulse 75   Temp 98.6 F (37 C) (Oral)   Resp 18   LMP 12/26/2016 (Exact Date)   SpO2 99%   Physical Exam  Constitutional: She appears well-developed and well-nourished. No distress.  HENT:  Head: Normocephalic and atraumatic.  Mouth/Throat: Oropharynx is clear and moist. No oropharyngeal exudate.  Eyes: Pupils are equal, round, and reactive to light. Conjunctivae are normal. Right eye exhibits no discharge. Left eye exhibits no discharge. No scleral icterus.  Neck: Normal range of  motion. Neck supple. No thyromegaly present.  Cardiovascular: Normal rate, regular rhythm, normal heart sounds and intact distal pulses.  Exam reveals no gallop and no friction rub.   No murmur heard. Pulmonary/Chest: Effort normal and breath sounds normal. No stridor. No respiratory distress. She has no wheezes. She has no rales.  Abdominal: Soft. Bowel sounds are normal. She exhibits no distension. There is no tenderness. There is no rebound and no guarding.  Genitourinary:  Genitourinary Comments: Chaperone present: Scant discharge, clear/white; very mild CMT and left adnexal tenderness    Musculoskeletal: She exhibits no edema.  Lymphadenopathy:    She has no cervical adenopathy.  Neurological: She is alert. Coordination normal.  Skin: Skin is warm and dry. No rash noted. She is not diaphoretic. No pallor.  Psychiatric: She has a normal mood and affect.  Nursing note and vitals reviewed.        ED Treatments / Results  Labs (all labs ordered are listed, but only abnormal results are displayed) Labs Reviewed  GC/CHLAMYDIA PROBE AMP ([place]) NOT AT [facility]    EKG  EKG Interpretation None       Radiology No results found.  Procedures Procedures (including critical care time)  Medications Ordered in ED Medications  cefTRIAXone (ROCEPHIN) injection 250 mg (250 mg Intramuscular Given 01/09/17 1307)  azithromycin (ZITHROMAX) tablet 1,000 mg (1,000 mg Oral Given 01/09/17 1307)     Initial Impression / Assessment and Plan / ED Course  I have reviewed the triage vital signs and the nursing notes.  Pertinent labs & imaging results that were available during my care of the patient were reviewed by me and considered in my medical decision making (see chart for details).     Patient returns after having to leave for personal reasons and come back. Labs were completed at first visit around 2 hours prior to second visit. CBC shows stable chronic anemia and heart cells, most likely from patient's iron deficiency anemia. CMP is unremarkable. UA is negative. Patient with vaginal discharge or rash, and lower abdominal pain. Pelvic exam is of low concern for PID. Patient opts for prophylactic treatment with Rocephin and azithromycin. I will also treat patient for bacterial vaginosis with Flagyl as wet prep showed clue cells. GC/chlamydia, HIV, RPR sent. Suspect potential syphilis considering rash to the feet. Strict return precautions given. Patient to follow-up health Department if results are positive for syphilis. Patient vitals stable throughout ED course and discharged  in satisfactory condition. I discussed patient case with Dr. Jacqulyn Bath who guided the patient's management and agrees with plan.   Final Clinical Impressions(s) / ED Diagnoses   Final diagnoses:  Pelvic pain in female  Bacterial vaginosis  Rash    New Prescriptions Discharge Medication List as of 01/09/2017  1:30 PM    START taking these medications   Details  metroNIDAZOLE (FLAGYL) 500 MG tablet Take 1 tablet (500 mg total) by mouth 2 (two) times daily., Starting Mon 01/09/2017, [address], [place], PA-C 01/09/17 2956    Maia Plan, MD 01/09/17 Jerene Bears

## 2017-01-09 NOTE — ED Notes (Signed)
Patient out of room-states she has to leave and will be back in 30 minutes.  Patient notified she will have to check back in again.  Patient voiced understanding.

## 2017-01-09 NOTE — ED Provider Notes (Signed)
[facility]   CSN: [id number] Arrival date & time: 01/09/17  0932     History   Chief Complaint Chief Complaint  Patient presents with  . Exposure to STD    HPI Jennifer Roberson is a 26 y.o. female who presents with a three-day history of intermittent lower abdominal pain, vaginal discharge, and rash to bilateral feet. Patient reports that her sister was diagnosed and treated for syphilis and she reports she shares drinks, food, and cigarettes with her and is afraid she may have been infected as well. Patient is sexually active herself, but had not had any sexual relations for 2 or 3 months until 2 days ago. Patient denies any fevers, chest pain, shortness of breath, nausea, vomiting, abnormal vaginal bleeding, or urinary symptoms. She has had a associated headache with her symptoms. Patient LMP 12/26/2016. She has not taken any medications over-the-counter for her symptoms.  HPI  Past Medical History:  Diagnosis Date  . Eczema   . Hypertension   . Miscarriage   . Seasonal allergies   . Trichomonas     Patient Active Problem List   Diagnosis Date Noted  . Left ovarian cyst 06/24/2016  . Chronic hypertension 06/24/2016    Past Surgical History:  Procedure Laterality Date  . NO PAST SURGERIES      OB History    Gravida Para Term Preterm AB Living   2       1     SAB TAB Ectopic Multiple Live Births   0   1   0       Home Medications    Prior to Admission medications   Medication Sig Start Date End Date Taking? Authorizing Provider  metroNIDAZOLE (FLAGYL) 500 MG tablet Take 1 tablet (500 mg total) by mouth 2 (two) times daily. 01/09/17   Emi Holes, PA-C    Family History Family History  Problem Relation Age of Onset  . Hypertension Maternal Grandfather   . Diabetes Maternal Grandfather   . Asthma Father     Social History Social History  Substance Use Topics  . Smoking status: Former Smoker    Packs/day: 0.50    Types:  Cigarettes    Quit date: 06/06/2016  . Smokeless tobacco: Never Used  . Alcohol use No     Allergies   Patient has no known allergies.   Review of Systems Review of Systems  Constitutional: Negative for chills and fever.  HENT: Negative for facial swelling and sore throat.   Respiratory: Negative for shortness of breath.   Cardiovascular: Negative for chest pain.  Gastrointestinal: Positive for abdominal pain. Negative for nausea and vomiting.  Genitourinary: Positive for pelvic pain and vaginal discharge. Negative for dysuria and vaginal bleeding.  Musculoskeletal: Negative for back pain.  Skin: Positive for rash. Negative for wound.  Neurological: Positive for headaches.  Psychiatric/Behavioral: The patient is not nervous/anxious.      Physical Exam Updated Vital Signs BP (!) 141/91 (BP Location: Left Arm)   Pulse 68   Temp 99 F (37.2 C) (Oral)   Resp 16   Ht 5\' 11"  (1.803 m)   Wt 73.8 kg (162 lb 11.2 oz)   LMP 12/26/2016 (Exact Date)   SpO2 100%   Breastfeeding? No   BMI 22.69 kg/m   Physical Exam  Constitutional: She appears well-developed and well-nourished. No distress.  HENT:  Head: Normocephalic and atraumatic.  Mouth/Throat: Oropharynx is clear and moist. No oropharyngeal exudate.  Eyes:  Pupils are equal, round, and reactive to light. Conjunctivae are normal. Right eye exhibits no discharge. Left eye exhibits no discharge. No scleral icterus.  Neck: Normal range of motion. Neck supple. No thyromegaly present.  Cardiovascular: Normal rate, regular rhythm, normal heart sounds and intact distal pulses.  Exam reveals no gallop and no friction rub.   No murmur heard. Pulmonary/Chest: Effort normal and breath sounds normal. No stridor. No respiratory distress. She has no wheezes. She has no rales.  Abdominal: Soft. Bowel sounds are normal. She exhibits no distension. There is no tenderness. There is no rebound and no guarding.  Musculoskeletal: She exhibits no  edema.  Lymphadenopathy:    She has no cervical adenopathy.  Neurological: She is alert. Coordination normal.  Skin: Skin is warm and dry. No rash noted. She is not diaphoretic. No pallor.  Rash to dorsum and soles of feet bilaterally- see photo  Psychiatric: She has a normal mood and affect.  Nursing note and vitals reviewed.        ED Treatments / Results  Labs (all labs ordered are listed, but only abnormal results are displayed) Labs Reviewed  WET PREP, GENITAL - Abnormal; Notable for the following:       Result Value   Clue Cells Wet Prep HPF POC PRESENT (*)    WBC, Wet Prep HPF POC FEW (*)    All other components within normal limits  CBC WITH DIFFERENTIAL/PLATELET - Abnormal; Notable for the following:    Hemoglobin 8.8 (*)    HCT 27.5 (*)    MCV 61.9 (*)    MCH 19.8 (*)    RDW 19.1 (*)    All other components within normal limits  COMPREHENSIVE METABOLIC PANEL - Abnormal; Notable for the following:    Calcium 8.8 (*)    ALT 11 (*)    All other components within normal limits  URINALYSIS, ROUTINE W REFLEX MICROSCOPIC  PREGNANCY, URINE  RPR  HIV ANTIBODY (ROUTINE TESTING)  GC/CHLAMYDIA PROBE AMP ([place]) NOT AT [facility]    EKG  EKG Interpretation None       Radiology No results found.  Procedures Procedures (including critical care time)  Medications Ordered in ED Medications - No data to display   Initial Impression / Assessment and Plan / ED Course  I have reviewed the triage vital signs and the nursing notes.  Pertinent labs & imaging results that were available during my care of the patient were reviewed by me and considered in my medical decision making (see chart for details).     Patient had to leave AMA for personal reasons prior to completion of workup. Complete MDM and results are present in second note completed by myself the same day.  Final Clinical Impressions(s) / ED Diagnoses   Final diagnoses:  Possible exposure to STD     New Prescriptions Discharge Medication List as of 01/09/2017 10:54 AM       Emi HolesLaw, Shakeia Krus M, PA-C 01/09/17 1636    Long, Arlyss RepressJoshua G, MD 01/09/17 [phone]

## 2017-01-09 NOTE — Discharge Instructions (Signed)
Medications: Flagyl  Treatment: Take Flagyl twice daily for your bacterial vaginosis. Do not drink alcohol within 24 hours of taking Flagyl. You will be called in 3 business days if your HIV or syphilis return positive.  Follow-up: Please follow-up with the health department for treatment if any of your results are positive. Please return to the emergency department if you develop any new or worsening symptoms. Please make all of your sexual partners aware that they will need to be treated as well if any of your results are positive. You have are deep and treated for gonorrhea and chlamydia today.

## 2017-01-09 NOTE — ED Triage Notes (Signed)
C/o abd pain x 3-4 days-denies n/v/d-positive vaginal d/c-NAD-steady gait-states she was here earlier and had to leave

## 2017-01-10 LAB — HIV ANTIBODY (ROUTINE TESTING W REFLEX): HIV Screen 4th Generation wRfx: NONREACTIVE

## 2017-01-10 LAB — RPR: RPR Ser Ql: NONREACTIVE

## 2017-01-10 LAB — GC/CHLAMYDIA PROBE AMP (~~LOC~~) NOT AT ARMC
Chlamydia: NEGATIVE
Neisseria Gonorrhea: NEGATIVE

## 2017-01-26 MED FILL — metroNIDAZOLE 500 MG TABS: 500 | 7 days supply | Qty: 14 | Fill #0

## 2017-08-24 ENCOUNTER — Encounter (HOSPITAL_BASED_OUTPATIENT_CLINIC_OR_DEPARTMENT_OTHER): Payer: Self-pay | Admitting: Emergency Medicine

## 2017-08-24 ENCOUNTER — Emergency Department (HOSPITAL_BASED_OUTPATIENT_CLINIC_OR_DEPARTMENT_OTHER)
Admission: EM | Admit: 2017-08-24 | Discharge: 2017-08-24 | Disposition: A | Discharge status: Home / Self Care | Payer: Self-pay | Attending: Emergency Medicine | Admitting: Emergency Medicine

## 2017-08-24 ENCOUNTER — Other Ambulatory Visit: Payer: Self-pay

## 2017-08-24 DIAGNOSIS — I10 Essential (primary) hypertension: Secondary | ICD-10-CM | POA: Insufficient documentation

## 2017-08-24 DIAGNOSIS — A6009 Herpesviral infection of other urogenital tract: Secondary | ICD-10-CM | POA: Insufficient documentation

## 2017-08-24 DIAGNOSIS — N760 Acute vaginitis: Secondary | ICD-10-CM | POA: Insufficient documentation

## 2017-08-24 DIAGNOSIS — Z87891 Personal history of nicotine dependence: Secondary | ICD-10-CM | POA: Insufficient documentation

## 2017-08-24 DIAGNOSIS — B9689 Other specified bacterial agents as the cause of diseases classified elsewhere: Secondary | ICD-10-CM | POA: Insufficient documentation

## 2017-08-24 HISTORY — DX: Herpesviral infection, unspecified: B00.9

## 2017-08-24 LAB — URINALYSIS, COMPLETE (UACMP) WITH MICROSCOPIC
BILIRUBIN URINE: NEGATIVE
GLUCOSE, UA: NEGATIVE mg/dL
HGB URINE DIPSTICK: NEGATIVE
KETONES UR: NEGATIVE mg/dL
NITRITE: NEGATIVE
PH: 6 (ref 5.0–8.0)
Protein, ur: NEGATIVE mg/dL
Specific Gravity, Urine: 1.02 (ref 1.005–1.030)

## 2017-08-24 LAB — WET PREP, GENITAL
Sperm: NONE SEEN
Trich, Wet Prep: NONE SEEN
YEAST WET PREP: NONE SEEN

## 2017-08-24 LAB — PREGNANCY, URINE: PREG TEST UR: NEGATIVE

## 2017-08-24 MED ORDER — METRONIDAZOLE 500 MG PO TABS: 500.0000 mg | ORAL_TABLET | Freq: Two times a day (BID) | ORAL | 0 refills | Status: DC

## 2017-08-24 MED ORDER — ACYCLOVIR 400 MG PO TABS: 400.0000 mg | ORAL_TABLET | Freq: Three times a day (TID) | ORAL | 0 refills | Status: AC

## 2017-08-24 NOTE — Discharge Instructions (Addendum)
Today your diagnosed with bacterial vaginosis and received a prescription for metronidazole also known as Flagyl. It is very important that you do not consume any alcohol while taking this medication as it will cause you to become violently ill.  Today you have been tested for gonorrhea and chlamydia.  The test to determine if you have these will take a few days.  You will only be called you if your test results come back positive, no news is good news.  If your tests are positive you will need to be treated.  Please follow-up with a primary care doctor and/or OB/GYN for evaluation of your symptoms today.  You may have diarrhea from the antibiotics.  It is very important that you continue to take the antibiotics even if you get diarrhea unless a medical professional tells you that you may stop taking them.  If you stop too early the bacteria you are being treated for will become stronger and you may need different, more powerful antibiotics that have more side effects and worsening diarrhea.  Please stay well hydrated and consider probiotics as they may decrease the severity of your diarrhea.  Please be aware that if you take any hormonal contraception (birth control pills, nexplanon, the ring, etc) that your birth control will not work while you are taking antibiotics and you need to use back up protection as directed on the birth control medication information insert.   While in the ED your blood pressure was high.  Please follow up with your primary care doctor or the wellness clinic for repeat evaluation as you may need medication.  High blood pressure can cause long term, potentially serious, damage if left untreated.

## 2017-08-24 NOTE — ED Provider Notes (Signed)
[facility] EMERGENCY DEPARTMENT Provider Note   CSN: [id number] Arrival date & time: 08/24/17  0915     History   Chief Complaint Chief Complaint  Patient presents with  . Vaginal Discharge    HPI Jennifer Roberson is a 27 y.o. female who presents today for evaluation of vaginal discharge.  She reports that for the past month she has been having vaginal drainage and a herpes outbreak.  She reports that she has had multiple herpes outbreaks over the past 2 years, is not on any suppressive medications.  She reports that she is having gray drainage, and watched "a bunch of YouTube videos" and feels like she has BV.  She denies any fevers or chills, no dysuria hematuria.  Denies any possibility of pregnancy.  No abdominal pain fevers or chills.  HPI  Past Medical History:  Diagnosis Date  . Eczema   . Herpes   . Hypertension   . Miscarriage   . Seasonal allergies   . Trichomonas     Patient Active Problem List   Diagnosis Date Noted  . Left ovarian cyst 06/24/2016  . Chronic hypertension 06/24/2016    Past Surgical History:  Procedure Laterality Date  . NO PAST SURGERIES      OB History    Gravida  2   Para      Term      Preterm      AB  1   Living        SAB  0   TAB      Ectopic  1   Multiple      Live Births  0            Home Medications    Prior to Admission medications   Medication Sig Start Date End Date Taking? Authorizing Provider  acyclovir (ZOVIRAX) 400 MG tablet Take 1 tablet (400 mg total) by mouth 3 (three) times daily for 5 days. 08/24/17 08/29/17  Cristina Gong, PA-C  metroNIDAZOLE (FLAGYL) 500 MG tablet Take 1 tablet (500 mg total) by mouth 2 (two) times daily. 08/24/17   Cristina Gong, PA-C    Family History Family History  Problem Relation Age of Onset  . Hypertension Maternal Grandfather   . Diabetes Maternal Grandfather   . Asthma Father     Social History Social History   Tobacco Use    . Smoking status: Former Smoker    Packs/day: 0.50    Types: Cigarettes    Last attempt to quit: 06/06/2016    Years since quitting: 1.2  . Smokeless tobacco: Never Used  Substance Use Topics  . Alcohol use: No  . Drug use: Yes    Frequency: 3.0 times per week    Types: Marijuana     Allergies   Patient has no known allergies.   Review of Systems Review of Systems  Constitutional: Negative for chills and fever.  Gastrointestinal: Negative for abdominal pain, diarrhea, nausea and vomiting.  Genitourinary: Positive for genital sores and vaginal discharge. Negative for decreased urine volume, difficulty urinating, dysuria, flank pain, frequency, hematuria, urgency, vaginal bleeding and vaginal pain.  Neurological: Negative for headaches.  All other systems reviewed and are negative.    Physical Exam Updated Vital Signs BP (!) 149/100 (BP Location: Right Arm)   Pulse 82   Temp 98.3 F (36.8 C) (Oral)   Resp 18   LMP 08/05/2017   SpO2 100%   Physical Exam  Constitutional: She appears  well-developed and well-nourished. No distress.  HENT:  Head: Normocephalic and atraumatic.  Eyes: Conjunctivae are normal. Right eye exhibits no discharge. Left eye exhibits no discharge. No scleral icterus.  Neck: Normal range of motion.  Cardiovascular: Normal rate and regular rhythm.  Pulmonary/Chest: Effort normal. No stridor. No respiratory distress.  Abdominal: She exhibits no distension.  Genitourinary:  Genitourinary Comments: Exam performed with chaperone in room.  No obvious blisters or ulcerations to external genitalia.  Internally there is obvious discharge that is white and creamy.  No adnexal mass tenderness or fullness.  No CMT.  Musculoskeletal: She exhibits no edema or deformity.  Neurological: She is alert. She exhibits normal muscle tone.  Skin: Skin is warm and dry. She is not diaphoretic.  Psychiatric: She has a normal mood and affect. Her behavior is normal.  Nursing  note and vitals reviewed.    ED Treatments / Results  Labs (all labs ordered are listed, but only abnormal results are displayed) Labs Reviewed  WET PREP, GENITAL - Abnormal; Notable for the following components:      Result Value   Clue Cells Wet Prep HPF POC PRESENT (*)    WBC, Wet Prep HPF POC MANY (*)    All other components within normal limits  URINALYSIS, COMPLETE (UACMP) WITH MICROSCOPIC - Abnormal; Notable for the following components:   Leukocytes, UA SMALL (*)    Squamous Epithelial / LPF 6-30 (*)    Bacteria, UA MANY (*)    All other components within normal limits  PREGNANCY, URINE  GC/CHLAMYDIA PROBE AMP ([place]) NOT AT [facility]    EKG  EKG Interpretation None       Radiology No results found.  Procedures Procedures (including critical care time)  Medications Ordered in ED Medications - No data to display   Initial Impression / Assessment and Plan / ED Course  I have reviewed the triage vital signs and the nursing notes.  Pertinent labs & imaging results that were available during my care of the patient were reviewed by me and considered in my medical decision making (see chart for details).  Clinical Course as of Aug 24 1212  Thu Aug 24, 2017  1152 6-30 squams, no symptoms, suspect contamination from BV.  Urinalysis, Complete w Microscopic(!) [EH]    Clinical Course User Index [EH] Cristina Gong, PA-C   Patient presents today for evaluation of vaginal discharge/drainage for approximately 1 month.  She also complains that she is having a herpes outbreak.  No obvious blisters or herpetic type lesions noted, discussed risk/benefit of acyclovir with patient, will treat with acyclovir after discussion with patient who is insistent that she is having an outbreak.  Wet prep obtained positive for BV.  Patient is treated with Flagyl prescription for this, she was advised not to drink alcohol while taking this medicine.  She was advised that she has  gonorrhea chlamydia cultures pending, declined empiric treatment.  She was advised that her blood pressure was mildly elevated and she needs follow-up for this.  I am not concerned for PID, no cervical motion tenderness.  Pregnancy test negative.  No abdominal pain or systemic symptoms.  Patient discharged home with outpatient follow-up.  Final Clinical Impressions(s) / ED Diagnoses   Final diagnoses:  BV (bacterial vaginosis)  Hypertension, unspecified type  Herpes genitalis in women    ED Discharge Orders        Ordered    metroNIDAZOLE (FLAGYL) 500 MG tablet  2 times daily  08/24/17 1157    acyclovir (ZOVIRAX) 400 MG tablet  3 times daily     08/24/17 1157       Cristina GongHammond, Iridian Reader W, [place]PA-C 08/24/17 1216    Doug SouJacubowitz, Sam, MD 08/24/17 1526

## 2017-08-24 NOTE — ED Triage Notes (Signed)
Pt c/o vaginal discharge x 1 month. Also states she is having a herpes outbreak.

## 2017-08-25 LAB — GC/CHLAMYDIA PROBE AMP (~~LOC~~) NOT AT ARMC
Chlamydia: NEGATIVE
NEISSERIA GONORRHEA: NEGATIVE

## 2017-12-05 ENCOUNTER — Emergency Department (HOSPITAL_BASED_OUTPATIENT_CLINIC_OR_DEPARTMENT_OTHER)
Admission: EM | Admit: 2017-12-05 | Discharge: 2017-12-05 | Disposition: A | Discharge status: Home / Self Care | Payer: Self-pay | Attending: Emergency Medicine | Admitting: Emergency Medicine

## 2017-12-05 ENCOUNTER — Emergency Department (HOSPITAL_BASED_OUTPATIENT_CLINIC_OR_DEPARTMENT_OTHER): Payer: Self-pay

## 2017-12-05 ENCOUNTER — Other Ambulatory Visit: Payer: Self-pay

## 2017-12-05 ENCOUNTER — Encounter (HOSPITAL_BASED_OUTPATIENT_CLINIC_OR_DEPARTMENT_OTHER): Payer: Self-pay | Admitting: *Deleted

## 2017-12-05 DIAGNOSIS — D500 Iron deficiency anemia secondary to blood loss (chronic): Secondary | ICD-10-CM | POA: Insufficient documentation

## 2017-12-05 DIAGNOSIS — I10 Essential (primary) hypertension: Secondary | ICD-10-CM | POA: Insufficient documentation

## 2017-12-05 DIAGNOSIS — N83292 Other ovarian cyst, left side: Secondary | ICD-10-CM | POA: Insufficient documentation

## 2017-12-05 DIAGNOSIS — R1031 Right lower quadrant pain: Secondary | ICD-10-CM

## 2017-12-05 DIAGNOSIS — N83202 Unspecified ovarian cyst, left side: Secondary | ICD-10-CM

## 2017-12-05 DIAGNOSIS — D649 Anemia, unspecified: Secondary | ICD-10-CM

## 2017-12-05 DIAGNOSIS — Z8742 Personal history of other diseases of the female genital tract: Secondary | ICD-10-CM

## 2017-12-05 DIAGNOSIS — R102 Pelvic and perineal pain: Secondary | ICD-10-CM

## 2017-12-05 DIAGNOSIS — F17210 Nicotine dependence, cigarettes, uncomplicated: Secondary | ICD-10-CM | POA: Insufficient documentation

## 2017-12-05 LAB — URINALYSIS, ROUTINE W REFLEX MICROSCOPIC
Bilirubin Urine: NEGATIVE
Glucose, UA: NEGATIVE mg/dL
HGB URINE DIPSTICK: NEGATIVE
Ketones, ur: NEGATIVE mg/dL
LEUKOCYTES UA: NEGATIVE
Nitrite: NEGATIVE
PROTEIN: NEGATIVE mg/dL
Specific Gravity, Urine: <1.005 — ABNORMAL LOW (ref 1.005–1.030)
pH: 7 (ref 5.0–8.0)

## 2017-12-05 LAB — COMPREHENSIVE METABOLIC PANEL
ALBUMIN: 4 g/dL (ref 3.5–5.0)
ALT: 13 U/L (ref 0–44)
ANION GAP: 7 (ref 5–15)
AST: 17 U/L (ref 15–41)
Alkaline Phosphatase: 73 U/L (ref 38–126)
BUN: 9 mg/dL (ref 6–20)
CHLORIDE: 104 mmol/L (ref 98–111)
CO2: 27 mmol/L (ref 22–32)
Calcium: 8.9 mg/dL (ref 8.9–10.3)
Creatinine, Ser: 0.62 mg/dL (ref 0.44–1.00)
GFR calc Af Amer: >60 mL/min (ref >60)
GFR calc non Af Amer: >60 mL/min (ref >60)
GLUCOSE: 81 mg/dL (ref 70–99)
POTASSIUM: 3.3 mmol/L — AB (ref 3.5–5.1)
SODIUM: 138 mmol/L (ref 135–145)
Total Bilirubin: 0.8 mg/dL (ref 0.3–1.2)
Total Protein: 7.7 g/dL (ref 6.5–8.1)

## 2017-12-05 LAB — CBC WITH DIFFERENTIAL/PLATELET
Basophils Absolute: 0.1 K/uL (ref 0.0–0.1)
Basophils Relative: 2 %
Eosinophils Absolute: 0.3 K/uL (ref 0.0–0.7)
Eosinophils Relative: 4 %
HCT: 23.1 % — ABNORMAL LOW (ref 36.0–46.0)
Hemoglobin: 6.6 g/dL — CL (ref 12.0–15.0)
Lymphocytes Relative: 22 %
Lymphs Abs: 1.6 K/uL (ref 0.7–4.0)
MCH: 15.6 pg — ABNORMAL LOW (ref 26.0–34.0)
MCHC: 28.6 g/dL — ABNORMAL LOW (ref 30.0–36.0)
MCV: 54.5 fL — ABNORMAL LOW (ref 78.0–100.0)
Monocytes Absolute: 0.6 K/uL (ref 0.1–1.0)
Monocytes Relative: 8 %
Neutro Abs: 4.5 K/uL (ref 1.7–7.7)
Neutrophils Relative %: 64 %
Platelets: 419 K/uL — ABNORMAL HIGH (ref 150–400)
RBC: 4.24 MIL/uL (ref 3.87–5.11)
RDW: 21.3 % — ABNORMAL HIGH (ref 11.5–15.5)
WBC: 7.1 K/uL (ref 4.0–10.5)

## 2017-12-05 LAB — PREGNANCY, URINE: Preg Test, Ur: NEGATIVE

## 2017-12-05 LAB — LIPASE, BLOOD: Lipase: 31 U/L (ref 11–51)

## 2017-12-05 MED ORDER — IOPAMIDOL (ISOVUE-300) INJECTION 61%
100.0000 mL | Freq: Once | INTRAVENOUS | Status: AC | PRN
  Administered 2017-12-05: 100 mL via INTRAVENOUS

## 2017-12-05 MED ORDER — MORPHINE SULFATE (PF) 2 MG/ML IV SOLN
1.0000 mg | Freq: Once | INTRAVENOUS | Status: DC
  Filled 2017-12-05: qty 1

## 2017-12-05 MED ORDER — SODIUM CHLORIDE 0.9 % IV BOLUS
1000.0000 mL | Freq: Once | INTRAVENOUS | Status: AC
  Administered 2017-12-05: 1000 mL via INTRAVENOUS

## 2017-12-05 MED ORDER — FERROUS SULFATE 325 (65 FE) MG PO TABS: 325.0000 mg | ORAL_TABLET | Freq: Every day | ORAL | 0 refills | Status: AC

## 2017-12-05 NOTE — ED Provider Notes (Signed)
  Physical Exam  BP (!) 156/105   Pulse 70   Temp 98.5 F (36.9 C) (Oral)   Resp 16   Ht 5\' 11"  (1.803 m)   Wt 59 kg (130 lb)   LMP 11/28/2017   SpO2 100%   BMI 18.13 kg/m    ED Course/Procedures   Clinical Course as of Dec 05 2313  Tue Dec 05, 2017  2114 Questionable findings on CT scan for possible ovarian pathology. Will order pelvic U/S to evaluate better.   [GM]  2302 Pelvic U/S resulted cyst on left ovary, but no torsion. Patient stable for discharge.    [GM]    Clinical Course User Index [GM] Windy CarinaMortis, Hoorain Kozakiewicz I, PA-C    Procedures  MDM  Care assumed by [facility] [address], PA-C. CT scan revealed no acute intra-abdominal process, but concerning findings around ovaries. Pelvic U/S revealed stable left ovarian cyst with minimal free fluid and no evidence of torsion. Patient is stable for outpatient management. Referral placed with OB/GYN and advised to follow-up there. Discussed OTC and supportive treatments for pain relief.      Reva BoresMortis, Abhishek Levesque I, PA-C 12/05/17 2317    Melene PlanFloyd, Dan, DO 12/05/17 2353

## 2017-12-05 NOTE — ED Notes (Signed)
Patient returned from US.

## 2017-12-05 NOTE — Discharge Instructions (Addendum)
Cyst on your left ovary seen on pelvic U/S. No appendicitis seen on CT scan. Follow-up with OB/GYN regarding the cyst on your ovary. You may take Ibuprofen/Naproxen to help with pain.  Also: You are very anemic. Take iron as directed. Follow up with your regular doctor as soon as possible to discuss your anemia, and for ongoing management of your anemia. Go to the nearest hospital emergency room [facility]([place], [place], [facility], etc) if you develop any symptoms of anemia such as fatigue, lightheadedness, shortness of breath, passing out, etc, as this may mean you need a blood transfusion.

## 2017-12-05 NOTE — ED Provider Notes (Cosign Needed)
[facility] EMERGENCY DEPARTMENT Provider Note   CSN: [id number] Arrival date & time: 12/05/17  1718     History   Chief Complaint Chief Complaint  Patient presents with  . Abdominal Pain    HPI Jennifer Roberson is a 27 y.o. female with a PMHx of HTN, herpes, L ovarian cyst, and other conditions listed below, who presents to the ED with complaints of gradual onset RLQ pain that began initially Saturday, 3 days ago.  Patient states that on Saturday the pain started initially in the periumbilical area and then started going into the RLQ, where it stayed.  On Sunday she had no pain but she had taken pain pills so she is not sure whether that was masking the pain.  On Monday the pain returned and was mild however today it was worse so she came in for evaluation.  She is very concerned that she could have appendicitis.  She describes the pain as 10/10 constant sharp nonradiating RLQ pain with no known aggravating factors and improved with the pain pills that she took on Monday, states that they were from her mother and she is not sure what they were.  She has not taken anything else for her pain.  LMP was 1wk ago.  She is sexually active with 1 female partner, unprotected.  Of note, she has a hx of "a chocolate cyst" on her L ovary; chart review reveals that she had a TVUS on 07/06/16 for pelvic pain during pregnancy, and it showed a "stable 5.7cm L ovarian hypoechoic mass most likely benign process such as an endometrioma".  She has never had any surgeries to confirm endometriosis diagnosis, she says she was just told it was a "chocolate cyst".  She later had a miscarriage of that pregnancy.  She drinks EtOH socially, last had EtOH last week.  She denies fevers, chills, CP, SOB, nausea/vomiting, diarrhea/constipation, obstipation, melena, hematochezia, hematuria, dysuria, vaginal bleeding/discharge, myalgias, arthralgias, numbness, tingling, focal weakness, or any other complaints at this time.  Denies recent travel, sick contacts, suspicious food intake, NSAID use, or prior abd surgeries.   Of note, she has been anemic for quite some time; has OTC pills she's supposed to take for it but doesn't like taking them because they "make her sick".  She has heavy menses, just finished one last week. She denies any fatigue, lightheadedness, SOB with exertion, or any other symptoms related to her anemia.   The history is provided by the patient and medical records. No language interpreter was used.  Abdominal Pain   Pertinent negatives include fever, diarrhea, nausea, vomiting, constipation, dysuria, hematuria, arthralgias and myalgias.    Past Medical History:  Diagnosis Date  . Eczema   . Herpes   . Hypertension   . Miscarriage   . Seasonal allergies   . Trichomonas     Patient Active Problem List   Diagnosis Date Noted  . Left ovarian cyst 06/24/2016  . Chronic hypertension 06/24/2016    Past Surgical History:  Procedure Laterality Date  . NO PAST SURGERIES       OB History    Gravida  2   Para      Term      Preterm      AB  1   Living        SAB  0   TAB      Ectopic  1   Multiple      Live Births  0  Home Medications    Prior to Admission medications   Medication Sig Start Date End Date Taking? Authorizing Provider  metroNIDAZOLE (FLAGYL) 500 MG tablet Take 1 tablet (500 mg total) by mouth 2 (two) times daily. 08/24/17   Cristina GongHammond, Elizabeth W, PA-C    Family History Family History  Problem Relation Age of Onset  . Hypertension Maternal Grandfather   . Diabetes Maternal Grandfather   . Asthma Father     Social History Social History   Tobacco Use  . Smoking status: Current Every Day Smoker    Packs/day: 0.50    Types: Cigarettes    Last attempt to quit: 06/06/2016    Years since quitting: 1.4  . Smokeless tobacco: Never Used  Substance Use Topics  . Alcohol use: No  . Drug use: Yes    Frequency: 3.0 times per week     Types: Marijuana     Allergies   Patient has no known allergies.   Review of Systems Review of Systems  Constitutional: Negative for chills, fatigue and fever.  Respiratory: Negative for shortness of breath.   Cardiovascular: Negative for chest pain.  Gastrointestinal: Positive for abdominal pain. Negative for blood in stool, constipation, diarrhea, nausea and vomiting.  Genitourinary: Negative for dysuria, hematuria, vaginal bleeding and vaginal discharge.  Musculoskeletal: Negative for arthralgias and myalgias.  Skin: Negative for color change.  Allergic/Immunologic: Negative for immunocompromised state.  Neurological: Negative for weakness, light-headedness and numbness.  Psychiatric/Behavioral: Negative for confusion.   All other systems reviewed and are negative for acute change except as noted in the HPI.    Physical Exam Updated Vital Signs BP (!) 135/92 (BP Location: Right Arm)   Pulse 84   Temp 98.5 F (36.9 C) (Oral)   Resp 18   Ht 5\' 11"  (1.803 m)   Wt 59 kg (130 lb)   LMP 11/28/2017   SpO2 100%   BMI 18.13 kg/m   Physical Exam  Constitutional: She is oriented to person, place, and time. Vital signs are normal. She appears well-developed and well-nourished.  Non-toxic appearance. No distress.  Afebrile, nontoxic, NAD  HENT:  Head: Normocephalic and atraumatic.  Mouth/Throat: Oropharynx is clear and moist and mucous membranes are normal.  Eyes: Conjunctivae and EOM are normal. Right eye exhibits no discharge. Left eye exhibits no discharge.  Neck: Normal range of motion. Neck supple.  Cardiovascular: Normal rate, regular rhythm, normal heart sounds and intact distal pulses. Exam reveals no gallop and no friction rub.  No murmur heard. Pulmonary/Chest: Effort normal and breath sounds normal. No respiratory distress. She has no decreased breath sounds. She has no wheezes. She has no rhonchi. She has no rales.  Abdominal: Soft. Normal appearance and bowel sounds  are normal. She exhibits no distension. There is tenderness in the right lower quadrant. There is tenderness at McBurney's point. There is no rigidity, no rebound, no guarding, no CVA tenderness and negative Murphy's sign.  Soft, nondistended, +BS throughout, with mild RLQ TTP over mcburney's point, no r/g/r, neg murphy's, neg psoas sign, neg foot tap test, no CVA TTP  Musculoskeletal: Normal range of motion.  Neurological: She is alert and oriented to person, place, and time. She has normal strength. No sensory deficit.  Skin: Skin is warm, dry and intact. No rash noted.  Psychiatric: She has a normal mood and affect.  Nursing note and vitals reviewed.    ED Treatments / Results  Labs (all labs ordered are listed, but only abnormal results are displayed)  Labs Reviewed  URINALYSIS, ROUTINE W REFLEX MICROSCOPIC - Abnormal; Notable for the following components:      Result Value   Specific Gravity, Urine <1.005 (*)    All other components within normal limits  CBC WITH DIFFERENTIAL/PLATELET - Abnormal; Notable for the following components:   Hemoglobin 6.6 (*)    HCT 23.1 (*)    MCV 54.5 (*)    MCH 15.6 (*)    MCHC 28.6 (*)    RDW 21.3 (*)    Platelets 419 (*)    All other components within normal limits  COMPREHENSIVE METABOLIC PANEL - Abnormal; Notable for the following components:   Potassium 3.3 (*)    All other components within normal limits  PREGNANCY, URINE  LIPASE, BLOOD    EKG None  Radiology No results found.  Procedures Procedures (including critical care time)  Medications Ordered in ED Medications  sodium chloride 0.9 % bolus 1,000 mL (1,000 mLs Intravenous New Bag/Given 12/05/17 1825)     Initial Impression / Assessment and Plan / ED Course  I have reviewed the triage vital signs and the nursing notes.  Pertinent labs & imaging results that were available during my care of the patient were reviewed by me and considered in my medical decision making (see  chart for details).     27 y.o. female here with here with RLQ pain x3 days. On exam, mild RLQ TTP near mcburney's point, nonperitoneal, neg psoas sign and neg foot tap test. Afebrile and nontoxic appearing. Pt very concerned about possibility of appendicitis. Advised that appendicitis was less likely given lack of other symptoms at this stage, and given fairly benign abd exam, however since she has mcburney's point tenderness, will proceed with labs and CT imaging. I suspect other etiologies could include ovarian cyst, since she has a hx of this (TVUS from 07/06/16 showed stable 5.7cm L ovarian hypoechoic mass most likely benign process such as an endometrioma); discussed that if CT did not show a definite cause then we may get pelvic U/S to evaluate the pelvic organs further, but will start with CT. Pt declines wanting anything for pain. Will give fluids and reassess shortly.   7:13 PM CMP with marginally low K 3.3, doubt need for repletion at this time. Lipase WNL. U/A unremarkable. Upreg neg. CBC w/diff without leukocytosis, but shows chronic anemia slightly worse since last visit in 01/2017 (hgb 8.8 then), Hgb today 6.6/Hct 23.1 with MCV 54.5; looking back it appears that her Hgb has always been fairly low, ranging from 7.0-10.5 since 2014, and mostly in the 7-9 range (only 2 values in the 10.2-10.5 range in Jan-Feb 2018); also with mild chronic thrombocytosis Plt 419. Pt asymptomatic as far as anemia is concerned, and this appears to be a chronic issue, likely related to heavy menses (just finished last week); she will need to be started on iron and f/up with her PCP but pt doesn't want admission/transfusion at this time, and I am agreeable to holding off right now since she's not actively bleeding nor is she having any symptoms related to her anemia currently. Pt understands that if she develops any symptoms of anemia, she may need transfusion, and to go to her nearest ED for recheck and possible  transfusion at that point. She declines wanting to do this now though. Discussed case with my attending Dr. Adela Lank who agrees with plan. Still awaiting CT, will reassess shortly.   7:54 PM Pt still feeling well, declines needing anything at this  time. CT abd/pelv not yet done, pt just now finishing contrast. Patient care to be resumed by Dagoberto Ligas, PA-C at shift change sign-out. Patient history has been discussed with midlevel resuming care. I anticipate that her symptoms are likely going to be from ovarian cyst; if CT negative for appendicitis and shows clear ovarian cyst, then I doubt she'd need further work up; if CT not completely clear on whether she has a cyst or not, then may consider pelvic U/S to further evaluate, but I doubt ovarian torsion at this point. Advised oncoming provider that she will need to go home on iron, and that I've already given pt strict return guidelines regarding her anemia; iron rx printed and given to oncoming provider. Please see their notes for further documentation of pending results and dispo/care. Pt stable at sign-out and updated on transfer of care.    Final Clinical Impressions(s) / ED Diagnoses   Final diagnoses:  RLQ abdominal pain  Chronic anemia  Hx of menorrhagia    ED Discharge Orders        Ordered    ferrous sulfate 325 (65 FE) MG tablet  Daily with breakfast     12/05/17 [address] [place], [place] 12/05/17 [phone]

## 2017-12-05 NOTE — ED Triage Notes (Signed)
Pt reports abd pain x Sunday off and on worse today, right lower quad, denies any n/v/d or fevers.

## 2017-12-05 NOTE — ED Notes (Signed)
ED Provider at bedside. 

## 2021-04-24 ENCOUNTER — Encounter (HOSPITAL_BASED_OUTPATIENT_CLINIC_OR_DEPARTMENT_OTHER): Payer: Self-pay | Admitting: Emergency Medicine

## 2021-04-24 ENCOUNTER — Emergency Department (HOSPITAL_BASED_OUTPATIENT_CLINIC_OR_DEPARTMENT_OTHER): Payer: Self-pay

## 2021-04-24 ENCOUNTER — Emergency Department (HOSPITAL_BASED_OUTPATIENT_CLINIC_OR_DEPARTMENT_OTHER)
Admission: EM | Admit: 2021-04-24 | Discharge: 2021-04-24 | Disposition: A | Discharge status: Home / Self Care | Payer: Self-pay | Attending: Emergency Medicine | Admitting: Emergency Medicine

## 2021-04-24 ENCOUNTER — Other Ambulatory Visit: Payer: Self-pay

## 2021-04-24 DIAGNOSIS — I10 Essential (primary) hypertension: Secondary | ICD-10-CM | POA: Insufficient documentation

## 2021-04-24 DIAGNOSIS — T148XXA Other injury of unspecified body region, initial encounter: Secondary | ICD-10-CM

## 2021-04-24 DIAGNOSIS — S61451A Open bite of right hand, initial encounter: Secondary | ICD-10-CM

## 2021-04-24 DIAGNOSIS — Z23 Encounter for immunization: Secondary | ICD-10-CM | POA: Insufficient documentation

## 2021-04-24 DIAGNOSIS — S61254A Open bite of right ring finger without damage to nail, initial encounter: Secondary | ICD-10-CM | POA: Insufficient documentation

## 2021-04-24 DIAGNOSIS — W540XXA Bitten by dog, initial encounter: Secondary | ICD-10-CM | POA: Insufficient documentation

## 2021-04-24 DIAGNOSIS — Z87891 Personal history of nicotine dependence: Secondary | ICD-10-CM | POA: Insufficient documentation

## 2021-04-24 MED ORDER — AMOXICILLIN-POT CLAVULANATE 875-125 MG PO TABS: 1.0000 | ORAL_TABLET | Freq: Two times a day (BID) | ORAL | 0 refills | Status: AC

## 2021-04-24 MED ORDER — AMOXICILLIN-POT CLAVULANATE 875-125 MG PO TABS
1.0000 | ORAL_TABLET | Freq: Once | ORAL | Status: AC
  Administered 2021-04-24: 1 via ORAL
  Filled 2021-04-24: qty 1

## 2021-04-24 MED ORDER — TETANUS-DIPHTH-ACELL PERTUSSIS 5-2.5-18.5 LF-MCG/0.5 IM SUSY
0.5000 mL | PREFILLED_SYRINGE | Freq: Once | INTRAMUSCULAR | Status: AC
  Administered 2021-04-24: 0.5 mL via INTRAMUSCULAR
  Filled 2021-04-24: qty 0.5

## 2021-04-24 MED ORDER — BACITRACIN ZINC 500 UNIT/GM EX OINT: 1.0000 "application " | TOPICAL_OINTMENT | Freq: Two times a day (BID) | CUTANEOUS | 0 refills | Status: AC

## 2021-04-24 NOTE — ED Triage Notes (Signed)
Pt reports dog bite to RT 4th digit on 10/31 by friend's dog; reports not healing; has not been seen/treated until now;

## 2021-04-24 NOTE — ED Provider Notes (Signed)
[facility] EMERGENCY DEPARTMENT Provider Note   CSN: [id number] Arrival date & time: 04/24/21  1930     History Chief Complaint  Patient presents with   Animal Bite    Jennifer Roberson is a 30 y.o. female.  Patient bit by her friend's dog on her right ring finger about 3 weeks ago.  Did not seek treatment at that time.  She has been doing some topical antibiotic and peroxide.  She states that there has been some drainage from puncture wounds.  No fever.  Not much discomfort or pain.  The dog has been fine.  Her tetanus shot is not up-to-date.  The history is provided by the patient.  Animal Bite Contact animal:  Dog Tetanus status:  Unknown Relieved by:  Nothing Ineffective treatments:  None tried Associated symptoms: swelling   Associated symptoms: no fever, no numbness and no rash       Past Medical History:  Diagnosis Date   Eczema    Herpes    Hypertension    Miscarriage    Seasonal allergies    Trichomonas     Patient Active Problem List   Diagnosis Date Noted   Left ovarian cyst 06/24/2016   Chronic hypertension 06/24/2016    Past Surgical History:  Procedure Laterality Date   NO PAST SURGERIES       OB History     Gravida  2   Para      Term      Preterm      AB  1   Living         SAB  0   IAB      Ectopic  1   Multiple      Live Births  0           Family History  Problem Relation Age of Onset   Hypertension Maternal Grandfather    Diabetes Maternal Grandfather    Asthma Father     Social History   Tobacco Use   Smoking status: Former    Packs/day: 0.50    Types: Cigarettes    Quit date: 06/06/2016    Years since quitting: 4.8   Smokeless tobacco: Never  Vaping Use   Vaping Use: Every day  Substance Use Topics   Alcohol use: Yes   Drug use: Not Currently    Frequency: 3.0 times per week    Types: Marijuana    Home Medications Prior to Admission medications   Medication Sig Start Date End  Date Taking? Authorizing Provider  amoxicillin-clavulanate (AUGMENTIN) 875-125 MG tablet Take 1 tablet by mouth 2 (two) times daily for 10 days. 04/24/21 05/04/21 Yes Kytzia Gienger, DO  bacitracin ointment Apply 1 application topically 2 (two) times daily. 04/24/21  Yes Klee Kolek, DO  ferrous sulfate 325 (65 FE) MG tablet Take 1 tablet (325 mg total) by mouth daily with breakfast. TAKE WITH ORANGE JUICE 12/05/17   Street, [place], PA-C  metroNIDAZOLE (FLAGYL) 500 MG tablet Take 1 tablet (500 mg total) by mouth 2 (two) times daily. 08/24/17   Cristina Gong, PA-C    Allergies    Patient has no known allergies.  Review of Systems   Review of Systems  Constitutional:  Negative for fever.  Musculoskeletal:  Negative for arthralgias.  Skin:  Positive for color change and wound. Negative for rash.  Neurological:  Negative for weakness and numbness.   Physical Exam Updated Vital Signs  ED Triage Vitals  Enc  Vitals Group     BP 04/24/21 1936 (!) 173/110     Pulse Rate 04/24/21 1936 84     Resp 04/24/21 1936 18     Temp 04/24/21 1936 98.2 F (36.8 C)     Temp Source 04/24/21 1936 Oral     SpO2 04/24/21 1936 99 %     Weight --      Height 04/24/21 1937 5\' 11"  (1.803 m)     Head Circumference --      Peak Flow --      Pain Score 04/24/21 1936 6     Pain Loc --      Pain Edu? --      Excl. in GC? --      Physical Exam Constitutional:      General: She is not in acute distress.    Appearance: She is not ill-appearing.  Cardiovascular:     Pulses: Normal pulses.  Pulmonary:     Effort: Pulmonary effort is normal.  Skin:    Capillary Refill: Capillary refill takes less than 2 seconds.     Findings: No erythema.     Comments: There are 2 puncture wounds on the right ring finger with some discoloration around the tip of the finger but no involvement of the nailbed or nail, there is no major purulent drainage or red streaking or erythema down the finger, skin changes are  fairly localized to the tip of the right ring finger just below the nailbed.  Neurological:     General: No focal deficit present.     Mental Status: She is alert.     Sensory: No sensory deficit.     Motor: No weakness.    ED Results / Procedures / Treatments   Labs (all labs ordered are listed, but only abnormal results are displayed) Labs Reviewed - No data to display  EKG None  Radiology DG Finger Ring Right  Result Date: 04/24/2021 CLINICAL DATA:  Recent dog bite to the fourth digit with swelling, initial encounter EXAM: RIGHT RING FINGER 2+V COMPARISON:  None. FINDINGS: Soft tissue swelling is noted about the fourth D IP joint. No acute fracture or dislocation is seen. No erosive changes are noted. No foreign body is noted. Post traumatic changes in the distal fifth phalanx are noted. IMPRESSION: Soft tissue swelling without acute bony abnormality. Electronically Signed   By: 04/26/2021 M.D.   On: 04/24/2021 20:16    Procedures Procedures   Medications Ordered in ED Medications  amoxicillin-clavulanate (AUGMENTIN) 875-125 MG per tablet 1 tablet (has no administration in time range)  Tdap (BOOSTRIX) injection 0.5 mL (0.5 mLs Intramuscular Given 04/24/21 1953)    ED Course  I have reviewed the triage vital signs and the nursing notes.  Pertinent labs & imaging results that were available during my care of the patient were reviewed by me and considered in my medical decision making (see chart for details).    MDM Rules/Calculators/A&P                           04/26/21 is here with swelling to the right ring finger.  She was bit by a friend's dog 3 weeks ago.  Dog is doing well.  No concern for rabies.  Tetanus shot updated.  Normal vitals.  No fever.  She has very focal swelling and some skin discoloration to the distal portion of the right ring finger below the nailbed.  2 puncture wounds with some scant drainage.  X-ray showed no signs of osteomyelitis.  No  concern for flexor tenosynovitis.  Overall she has infectious process at the tip of her finger.  We will start her on antibiotics.  Tetanus shot was updated.  Recommend warm water and soap soaks twice daily with topical bacitracin as well.  Recommend discontinuing hydrogen peroxide which she has been doing fairly often.  We will have her follow-up with hand surgery as she likely needs some sort of debridement.  No concern for systemic infection.  Discharged in good condition.  Understands return precautions.  This chart was dictated using voice recognition software.  Despite best efforts to proofread,  errors can occur which can change the documentation meaning.   Final Clinical Impression(s) / ED Diagnoses Final diagnoses:  Animal bite  Dog bite of right hand, initial encounter    Rx / DC Orders ED Discharge Orders          Ordered    amoxicillin-clavulanate (AUGMENTIN) 875-125 MG tablet  2 times daily        04/24/21 2036    bacitracin ointment  2 times daily        04/24/21 2036             Virgina Norfolk, DO 04/24/21 2038

## 2021-04-24 NOTE — Discharge Instructions (Signed)
Recommend warm water and soap soaks twice daily followed by bacitracin ointment placement as discussed.  Take antibiotics as prescribed.  Follow-up with Dr. Yehuda Budd with hand team.

## 2021-06-17 ENCOUNTER — Encounter (HOSPITAL_BASED_OUTPATIENT_CLINIC_OR_DEPARTMENT_OTHER): Payer: Self-pay | Admitting: *Deleted

## 2021-06-17 ENCOUNTER — Other Ambulatory Visit: Payer: Self-pay

## 2021-06-17 ENCOUNTER — Emergency Department (HOSPITAL_BASED_OUTPATIENT_CLINIC_OR_DEPARTMENT_OTHER)
Admission: EM | Admit: 2021-06-17 | Discharge: 2021-06-18 | Disposition: A | Discharge status: Home / Self Care | Payer: Self-pay | Attending: Emergency Medicine | Admitting: Emergency Medicine

## 2021-06-17 DIAGNOSIS — Z87891 Personal history of nicotine dependence: Secondary | ICD-10-CM | POA: Insufficient documentation

## 2021-06-17 DIAGNOSIS — I10 Essential (primary) hypertension: Secondary | ICD-10-CM | POA: Insufficient documentation

## 2021-06-17 DIAGNOSIS — Z20822 Contact with and (suspected) exposure to covid-19: Secondary | ICD-10-CM | POA: Insufficient documentation

## 2021-06-17 DIAGNOSIS — J069 Acute upper respiratory infection, unspecified: Secondary | ICD-10-CM | POA: Insufficient documentation

## 2021-06-17 NOTE — ED Triage Notes (Signed)
C/o cough x 1 week.

## 2021-06-18 ENCOUNTER — Emergency Department (HOSPITAL_BASED_OUTPATIENT_CLINIC_OR_DEPARTMENT_OTHER): Payer: Self-pay

## 2021-06-18 LAB — RESP PANEL BY RT-PCR (FLU A&B, COVID) ARPGX2
Influenza A by PCR: NEGATIVE
Influenza B by PCR: NEGATIVE
SARS Coronavirus 2 by RT PCR: NEGATIVE

## 2021-06-18 MED ORDER — FLUTICASONE PROPIONATE HFA 44 MCG/ACT IN AERO
2.0000 | INHALATION_SPRAY | Freq: Two times a day (BID) | RESPIRATORY_TRACT | Status: DC
  Administered 2021-06-18: 2 via RESPIRATORY_TRACT
  Filled 2021-06-18: qty 10.6

## 2021-06-18 NOTE — ED Provider Notes (Signed)
[facility] Provider Note: Lowella Dell, MD, FACEP  CSN: [id number] MRN: [id number] ARRIVAL: 06/17/21 at 2307 ROOM: [facility]07/[facility]07   CHIEF COMPLAINT  Cough   HISTORY OF PRESENT ILLNESS  06/18/21 1:29 AM Jennifer Roberson is a 31 y.o. female who has had a cough for about a week.  The cough is productive and persistent.  She has been using Mucinex, DayQuil, NyQuil and TheraFlu without relief.  She is not having a fever.  She is not wheezing.  She is concerned she may have fluid in her lungs.   Past Medical History:  Diagnosis Date   Eczema    Herpes    Hypertension    Miscarriage    Seasonal allergies    Trichomonas     Past Surgical History:  Procedure Laterality Date   NO PAST SURGERIES      Family History  Problem Relation Age of Onset   Hypertension Maternal Grandfather    Diabetes Maternal Grandfather    Asthma Father     Social History   Tobacco Use   Smoking status: Former    Packs/day: 0.50    Types: Cigarettes    Quit date: 06/06/2016    Years since quitting: 5.0   Smokeless tobacco: Never  Vaping Use   Vaping Use: Every day  Substance Use Topics   Alcohol use: Yes   Drug use: Not Currently    Frequency: 3.0 times per week    Types: Marijuana    Prior to Admission medications   Medication Sig Start Date End Date Taking? Authorizing Provider  bacitracin ointment Apply 1 application topically 2 (two) times daily. 04/24/21   Curatolo, Adam, DO  ferrous sulfate 325 (65 FE) MG tablet Take 1 tablet (325 mg total) by mouth daily with breakfast. TAKE WITH ORANGE JUICE 12/05/17   Street, [place], [place]    Allergies Patient has no known allergies.   REVIEW OF SYSTEMS  Negative except as noted here or in the History of Present Illness.   PHYSICAL EXAMINATION  Initial Vital Signs Blood pressure (!) 152/107, pulse 81, temperature 97.8 F (36.6 C), temperature source Oral, resp. rate 18, height 5\' 11"  (1.803 m), weight 97.5 kg, last menstrual  period 06/11/2021, SpO2 100 %.  Examination General: Well-developed, well-nourished female in no acute distress; appearance consistent with age of record HENT: normocephalic; atraumatic Eyes: Normal appearance Neck: supple Heart: regular rate and rhythm Lungs: clear to auscultation bilaterally Abdomen: soft; nondistended; nontender; bowel sounds present Extremities: No deformity; full range of motion Neurologic: Awake, alert and oriented; motor function intact in all extremities and symmetric; no facial droop Skin: Warm and dry Psychiatric: Normal mood and affect   RESULTS  Summary of this visit's results, reviewed and interpreted by myself:   EKG Interpretation  Date/Time:    Ventricular Rate:    PR Interval:    QRS Duration:   QT Interval:    QTC Calculation:   R Axis:     Text Interpretation:         Laboratory Studies: Results for orders placed or performed during the hospital encounter of 06/17/21 (from the past 24 hour(s))  Resp Panel by RT-PCR (Flu A&B, Covid) Nasopharyngeal Swab     Status: None   Collection Time: 06/17/21 11:30 PM   Specimen: Nasopharyngeal Swab; Nasopharyngeal(NP) swabs in vial transport medium  Result Value Ref Range   SARS Coronavirus 2 by RT PCR NEGATIVE NEGATIVE   Influenza A by PCR NEGATIVE NEGATIVE   Influenza  B by PCR NEGATIVE NEGATIVE   Imaging Studies: DG Chest 2 View  Result Date: 06/18/2021 CLINICAL DATA:  Cough for 1 week. EXAM: CHEST - 2 VIEW COMPARISON:  Chest x-ray 07/15/2009. FINDINGS: The heart size and mediastinal contours are within normal limits. Both lungs are clear. The visualized skeletal structures are unremarkable. IMPRESSION: No active cardiopulmonary disease. Electronically Signed   By: Darliss Cheney M.D.   On: 06/18/2021 01:46    ED COURSE and MDM  Nursing notes, initial and subsequent vitals signs, including pulse oximetry, reviewed and interpreted by myself.  Vitals:   06/17/21 2324 06/18/21 0127  BP:  (!)  152/107  Pulse:  81  Resp:  18  Temp:  97.8 F (36.6 C)  TempSrc:  Oral  SpO2:  100%  Weight: 97.5 kg   Height: 5\' 11"  (1.803 m)    Medications  fluticasone (FLOVENT HFA) 44 MCG/ACT inhaler 2 puff (has no administration in time range)    Patient appears to be having an episode of prolonged bronchitis following a likely viral illness.  We will prescribe a steroid inhaler to help with symptoms as over-the-counter cough medications do not contain a steroid.  I do not believe she needs antibiotics at this time as her chest x-ray is clear and she is afebrile.  PROCEDURES  Procedures   ED DIAGNOSES     ICD-10-CM   1. Viral URI with cough  J06.9          Marvell Tamer, MD 06/18/21 0202

## 2023-05-17 IMAGING — CR DG FINGER RING 2+V*R*
3 series · 3 of 3 positions shown · non-contrast
Comparison: None.

CLINICAL DATA: Recent dog bite to the fourth digit with swelling,
initial encounter

EXAM:
RIGHT RING FINGER 2+V

[x finger pa right]
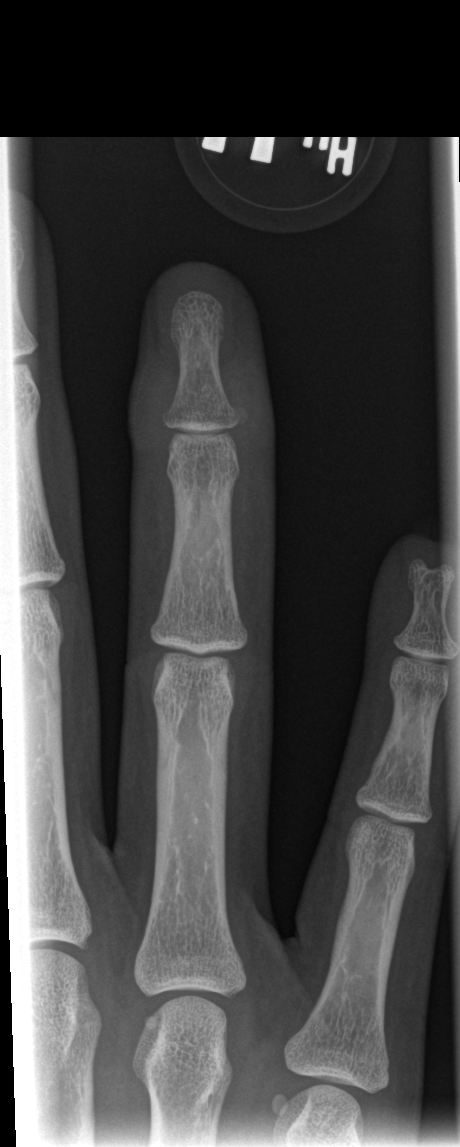

[x finger obl. right]
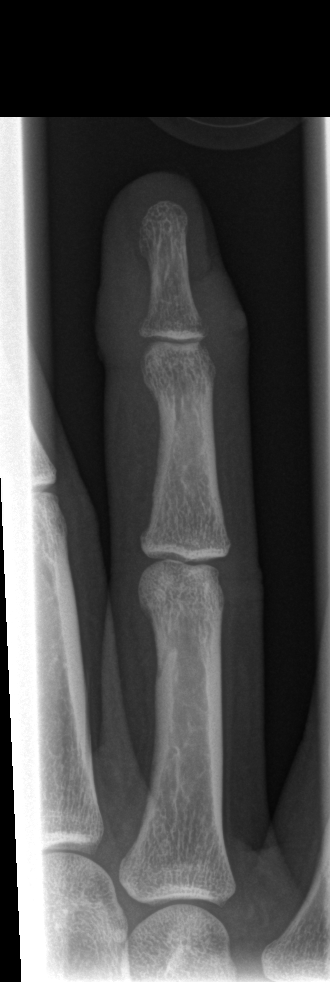

[x finger lateral right]
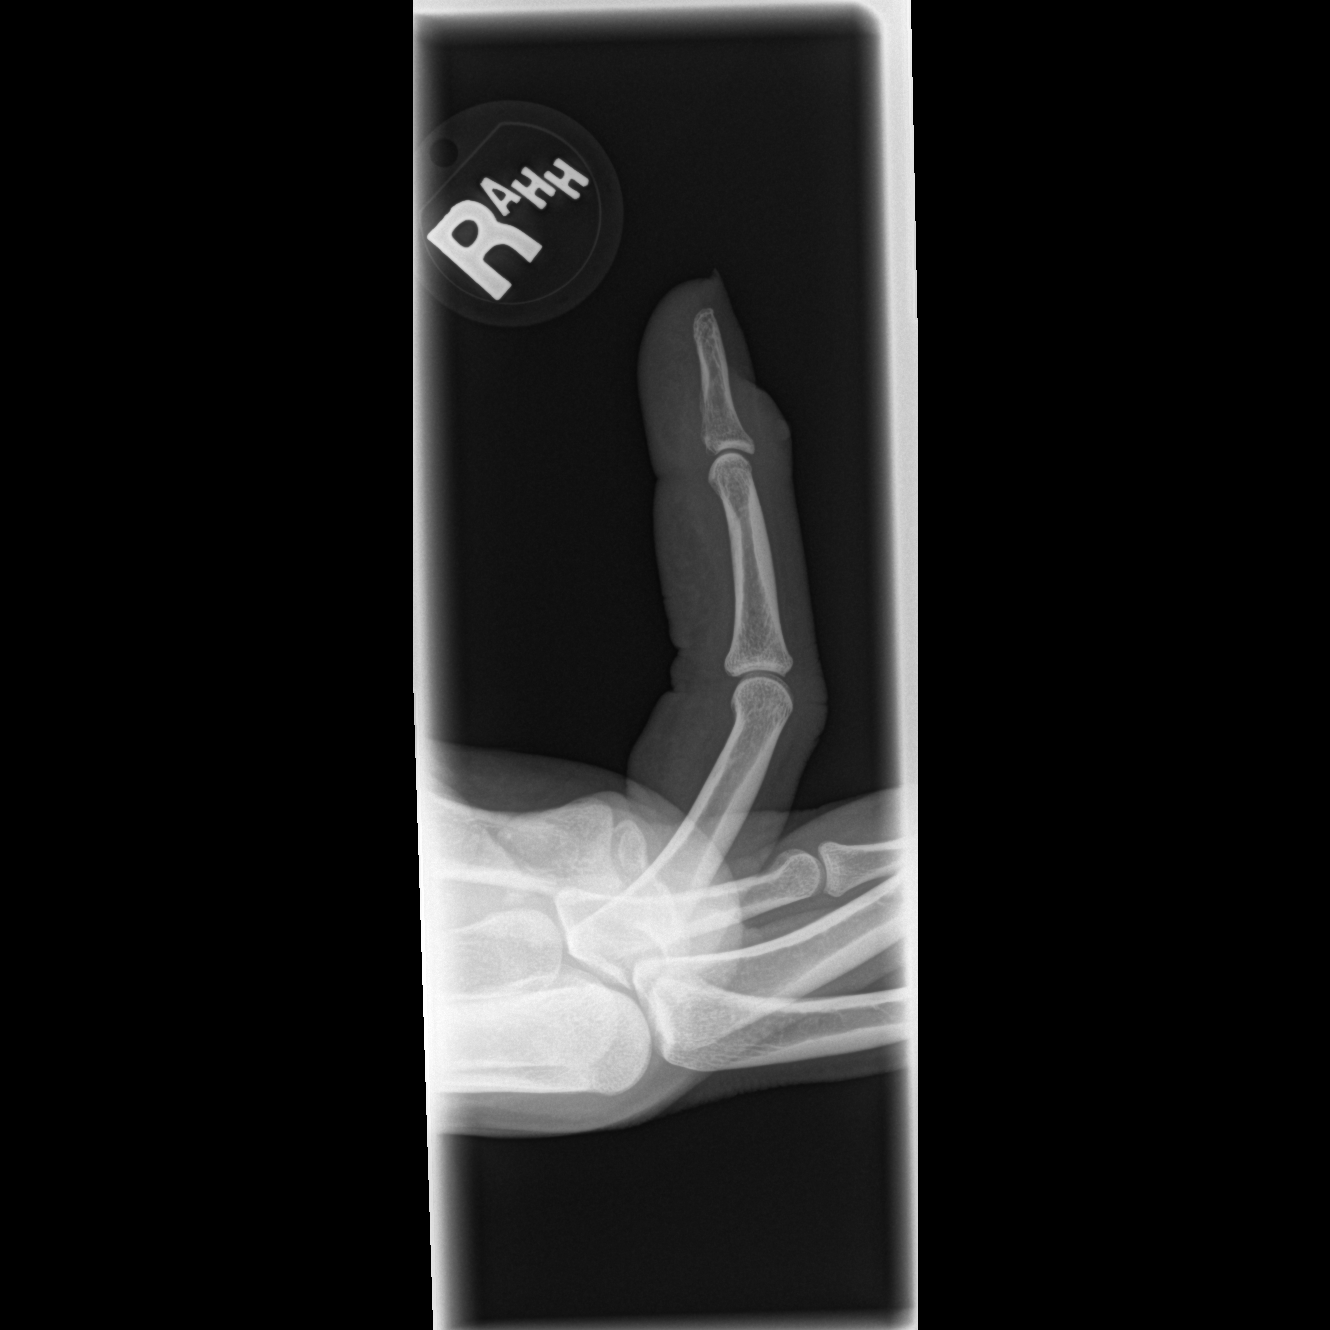

[3 of 3 positions shown; findings below may reference images not displayed]

FINDINGS: Soft tissue swelling is noted about the fourth D IP joint. No acute
fracture or dislocation is seen. No erosive changes are noted. No
foreign body is noted. Post traumatic changes in the distal fifth
phalanx are noted.
IMPRESSION: Soft tissue swelling without acute bony abnormality.
